# Patient Record
Sex: Female | Born: 1985 | Race: White | Hispanic: No | Marital: Married | Smoking: Never smoker
Health system: Southern US, Community
[De-identification: ages and names within clinical notes are randomized; demographics above are authoritative.]

## PROBLEM LIST (undated history)

## (undated) DIAGNOSIS — Z8619 Personal history of other infectious and parasitic diseases: Secondary | ICD-10-CM

## (undated) DIAGNOSIS — F32A Depression, unspecified: Secondary | ICD-10-CM

## (undated) DIAGNOSIS — R011 Cardiac murmur, unspecified: Secondary | ICD-10-CM

## (undated) DIAGNOSIS — F329 Major depressive disorder, single episode, unspecified: Secondary | ICD-10-CM

## (undated) HISTORY — DX: Personal history of other infectious and parasitic diseases: Z86.19

## (undated) HISTORY — DX: Cardiac murmur, unspecified: R01.1

## (undated) HISTORY — PX: NO PAST SURGERIES: SHX2092

---

## 2007-01-06 ENCOUNTER — Ambulatory Visit: Payer: Self-pay | Admitting: Family Medicine

## 2007-01-06 DIAGNOSIS — L708 Other acne: Secondary | ICD-10-CM | POA: Insufficient documentation

## 2007-01-06 DIAGNOSIS — J309 Allergic rhinitis, unspecified: Secondary | ICD-10-CM | POA: Insufficient documentation

## 2007-10-14 ENCOUNTER — Emergency Department (HOSPITAL_BASED_OUTPATIENT_CLINIC_OR_DEPARTMENT_OTHER): Admission: EM | Admit: 2007-10-14 | Discharge: 2007-10-14 | Payer: Self-pay | Admitting: Emergency Medicine

## 2008-09-30 ENCOUNTER — Ambulatory Visit: Payer: Self-pay | Admitting: Family Medicine

## 2008-09-30 LAB — CONVERTED CEMR LAB
Protein, U semiquant: 30
Specific Gravity, Urine: >=1.03
Urobilinogen, UA: 0.2

## 2008-11-07 ENCOUNTER — Ambulatory Visit: Payer: Self-pay | Admitting: Family Medicine

## 2008-11-07 ENCOUNTER — Encounter: Payer: Self-pay | Admitting: Family Medicine

## 2008-11-07 ENCOUNTER — Other Ambulatory Visit: Admission: RE | Admit: 2008-11-07 | Discharge: 2008-11-07 | Payer: Self-pay | Admitting: Family Medicine

## 2008-12-29 ENCOUNTER — Encounter: Payer: Self-pay | Admitting: Family Medicine

## 2009-09-01 ENCOUNTER — Ambulatory Visit: Payer: Self-pay | Admitting: Family Medicine

## 2009-09-01 DIAGNOSIS — R5381 Other malaise: Secondary | ICD-10-CM | POA: Insufficient documentation

## 2009-09-01 DIAGNOSIS — R5383 Other fatigue: Secondary | ICD-10-CM

## 2009-09-02 ENCOUNTER — Encounter: Payer: Self-pay | Admitting: Family Medicine

## 2009-09-02 LAB — CONVERTED CEMR LAB
Basophils Relative: 0 % (ref 0–1)
Eosinophils Absolute: 0.2 10*3/uL (ref 0.0–0.7)
Eosinophils Relative: 1 % (ref 0–5)
HCT: 38.3 % (ref 36.0–46.0)
Lymphs Abs: 5.3 10*3/uL — ABNORMAL HIGH (ref 0.7–4.0)
MCHC: 31.9 g/dL (ref 30.0–36.0)
MCV: 87.6 fL (ref 78.0–100.0)
Monocytes Relative: 7 % (ref 3–12)
Platelets: 378 10*3/uL (ref 150–400)
WBC: 12.1 10*3/uL — ABNORMAL HIGH (ref 4.0–10.5)

## 2009-11-26 ENCOUNTER — Other Ambulatory Visit: Admission: RE | Admit: 2009-11-26 | Discharge: 2009-11-26 | Payer: Self-pay | Admitting: Family Medicine

## 2009-11-26 ENCOUNTER — Ambulatory Visit: Payer: Self-pay | Admitting: Family Medicine

## 2010-05-12 NOTE — Assessment & Plan Note (Signed)
Summary: check LN   Vital Signs:  Patient profile:   25 year old female Height:      63.5 inches Weight:      174 pounds BMI:     30.45 O2 Sat:      99 % on Room air Temp:     98.4 degrees F oral Pulse rate:   80 / minute BP sitting:   126 / 73  (left arm) Cuff size:   regular  Vitals Entered By: Payton Spark CMA (Sep 01, 2009 4:23 PM)  O2 Flow:  Room air CC: Swollen gland in neck x 3 days.    Primary Care Provider:  Linford Arnold, C  CC:  Swollen gland in neck x 3 days. Marland Kitchen  History of Present Illness: 25 yo WF presents for a swollen LN on the L side of her neck x 3 days.  It is tender.  She has not had a sore throat, fever, ear pain.  No sinus congestion.  No current HA.  No skin problems.    She is otherwise feeling well.  No problems swallowing.  A little tired.  Current Medications (verified): 1)  Claritin 10 Mg Tabs (Loratadine) .... Take 1 Tab By Mouth Once Daily 2)  Yaz 3-0.02 Mg Tabs (Drospirenone-Ethinyl Estradiol) .... Take 1 Tablet By Mouth Once A Day 3)  Differin 0.1 %  Gel (Adapalene) .... Apply To Face At Bedtime  Allergies (verified): No Known Drug Allergies  Past History:  Past Medical History: Reviewed history from 01/06/2007 and no changes required. None  Review of Systems      See HPI  Physical Exam  General:  alert, well-developed, well-nourished, and well-hydrated.   Head:  normocephalic and atraumatic.   Mouth:  good dentition and pharynx pink and moist.   Neck:  supple.  normal sized thyroid gland with a small palpable cyst vs anterior cervical chain LN on the L side, tender and moveable Lungs:  Normal respiratory effort, chest expands symmetrically. Lungs are clear to auscultation, no crackles or wheezes. Heart:  Normal rate and regular rhythm. S1 and S2 normal without gallop, murmur, click, rub or other extra sounds. Skin:  color normal and no suspicious lesions.     Impression & Recommendations:  Problem # 1:  CERVICAL LYMPHADENOPATHY  (ICD-785.6) REactive cervical lymphadenopathy vs a small thyroid cyst given this location. Will get a CBC with diff and a TSH today.  F/U results tomorrow. No sign of head infection and no other lymphadenopathy.  U/S thryoid gland if not improved after 10 days. Orders: T-CBC w/Diff ([id number])  Complete Medication List: 1)  Claritin 10 Mg Tabs (Loratadine) .... Take 1 tab by mouth once daily 2)  Yaz 3-0.02 Mg Tabs (Drospirenone-ethinyl estradiol) .... Take 1 tablet by mouth once a day 3)  Differin 0.1 % Gel (Adapalene) .... Apply to face at bedtime  Other Orders: T-TSH ([id number])  Patient Instructions: 1)  Lab today. 2)  Will call you w/ results tomorrow. 3)  Use heat/ ice/ ibuprofen for comfort. 4)  Return in 10 days if not improved.

## 2010-05-12 NOTE — Assessment & Plan Note (Signed)
Summary: Pap and birth control   Vital Signs:  Patient profile:   25 year old female LMP:     11/03/2009 Height:      63.5 inches Weight:      177 pounds Pulse rate:   72 / minute BP sitting:   111 / 75  (left arm) Cuff size:   regular  Vitals Entered By: Avon Gully CMA, Duncan Dull) (November 26, 2009 8:38 AM) CC: PAP--need refill of BCP LMP (date): 11/03/2009     Enter LMP: 11/03/2009 Last PAP Result **ATYPICAL SQUAMOUS CELLS OF UNDETERMINED SIGNIFICANCE   Primary Care Provider:  Linford Arnold, C  CC:  PAP--need refill of BCP.  History of Present Illness: Pap only and refill on OCPs. She is very happywiht her current OCPs.    Current Medications (verified): 1)  Claritin 10 Mg Tabs (Loratadine) .... Take 1 Tab By Mouth Once Daily 2)  Yaz 3-0.02 Mg Tabs (Drospirenone-Ethinyl Estradiol) .... Take 1 Tablet By Mouth Once A Day 3)  Differin 0.1 %  Gel (Adapalene) .... Apply To Face At Bedtime  Allergies (verified): No Known Drug Allergies  Comments:  Nurse/Medical Assistant: The patient's medications and allergies were reviewed with the patient and were updated in the Medication and Allergy Lists. Avon Gully CMA, Duncan Dull) (November 26, 2009 8:39 AM)  Physical Exam  General:  Well-developed,well-nourished,in no acute distress; alert,appropriate and cooperative throughout examination Genitalia:  Normal introitus for age, no external lesions, no vaginal discharge, mucosa pink and moist, no vaginal or cervical lesions, no vaginal atrophy, no friaility or hemorrhage, normal uterus size and position, no adnexal masses or tenderness   Impression & Recommendations:  Problem # 1:  SCREENING FOR MALIGNANT NEOPLASM OF THE CERVIX (ICD-V76.2) Pap performed will call with resulsin 2 weeks. Bc of age will screen for GC and chlam.  Rfeilled her OCPs.   Complete Medication List: 1)  Claritin 10 Mg Tabs (Loratadine) .... Take 1 tab by mouth once daily 2)  Yaz 3-0.02 Mg Tabs  (Drospirenone-ethinyl estradiol) .... Take 1 tablet by mouth once a day 3)  Differin 0.1 % Gel (Adapalene) .... Apply to face at bedtime Prescriptions: YAZ 3-0.02 MG TABS (DROSPIRENONE-ETHINYL ESTRADIOL) Take 1 tablet by mouth once a day  #3 pack x 4   Entered and Authorized by:   Nani Gasser MD   Signed by:   Nani Gasser MD on 11/26/2009   Method used:   Electronically to        [facility] [phone]* (retail)       [address]       [place], [place]  [address]       Ph: [phone]       Fax: [phone]   RxID:   [id number]

## 2011-06-21 ENCOUNTER — Emergency Department
Admission: EM | Admit: 2011-06-21 | Discharge: 2011-06-21 | Disposition: A | Discharge status: Home Health | Payer: Managed Care, Other (non HMO) | Source: Home / Self Care

## 2011-06-21 ENCOUNTER — Encounter: Payer: Self-pay | Admitting: *Deleted

## 2011-06-21 DIAGNOSIS — J209 Acute bronchitis, unspecified: Secondary | ICD-10-CM

## 2011-06-21 MED ORDER — CLARITHROMYCIN 500 MG PO TABS: 500.0000 mg | ORAL_TABLET | Freq: Two times a day (BID) | ORAL | Status: AC

## 2011-06-21 MED ORDER — BENZONATATE 200 MG PO CAPS: 200.0000 mg | ORAL_CAPSULE | Freq: Every day | ORAL | Status: AC

## 2011-06-21 NOTE — ED Notes (Signed)
Patient c/o productive cough x 2 weeks. Denies fever. Used robitussin and mucinex otc.

## 2011-06-21 NOTE — ED Provider Notes (Signed)
History     CSN: [id number]  Arrival date & time 06/21/11  4098   None     Chief Complaint  Patient presents with  . Cough      HPI Comments: Patient complains of approximately 2.5 week history of gradually progressive URI symptoms beginning with nasal congestion but no sore throat.  A cough started about 2 weeks ago.  Complains of fatigue and initial myalgias.  Cough is now worse at night and generally non-productive during the day.  There has been no pleuritic pain, shortness of breath, or wheezes.  She now coughs until she gags.  She believes that she may have had a tetanus shot in 2009.  The history is provided by the patient.    History reviewed. No pertinent past medical history.  History reviewed. No pertinent past surgical history.  History reviewed. No pertinent family history.  History  Substance Use Topics  . Smoking status: Never Smoker   . Smokeless tobacco: Not on file  . Alcohol Use: No    OB History    Grav Para Term Preterm Abortions TAB SAB Ect Mult Living                  Review of Systems No sore throat + cough No pleuritic pain No wheezing + nasal congestion + post-nasal drainage No sinus pain/pressure No itchy/red eyes No earache No hemoptysis No SOB No fever/chills No nausea No vomiting No abdominal pain No diarrhea No urinary symptoms No skin rashes + fatigue No myalgias No headache Used OTC meds without relief (Robitussin) Allergies  Review of patient's allergies indicates no known allergies.  Home Medications   Current Outpatient Rx  Name Route Sig Dispense Refill  . BENZONATATE 200 MG PO CAPS Oral Take 1 capsule (200 mg total) by mouth at bedtime. Take as needed for cough 12 capsule 0  . CLARITHROMYCIN 500 MG PO TABS Oral Take 1 tablet (500 mg total) by mouth 2 (two) times daily. Take for one week 14 tablet 0    BP 128/79  Pulse 79  Temp(Src) 97.5 F (36.4 C) (Oral)  Resp 14  Ht 5\' 3"  (1.6 m)  Wt 175 lb (79.379  kg)  BMI 31.00 kg/m2  SpO2 100%  Physical Exam Nursing notes and Vital Signs reviewed. Appearance:  Patient appears healthy, stated age, and in no acute distress Eyes:  Pupils are equal, round, and reactive to light and accomodation.  Extraocular movement is intact.  Conjunctivae are not inflamed  Ears:  Canals normal.  Tympanic membranes normal.  Nose:  Mildly congested turbinates.  No sinus tenderness.   Pharynx:  Normal Neck:  Supple.  Slightly tender shotty posterior nodes are palpated bilaterally  Lungs:  Clear to auscultation.  Breath sounds are equal.  Heart:  Regular rate and rhythm without murmurs, rubs, or gallops.  Abdomen:  Nontender without masses or hepatosplenomegaly.  Bowel sounds are present.  No CVA or flank tenderness.  Extremities:  No edema.  No calf tenderness Skin:  No rash present.   ED Course  Procedures  none      1. Acute bronchitis       MDM  ? Pertussis Begin Biaxin, and Tessalon at bedtime Take plain Mucinex (guaifenesin) twice daily for cough and congestion.  Increase fluid intake  May add Sudafed for sinus congestion. Stop all antihistamines for now, and other non-prescription cough/cold preparations. Recommend a Tdap when well.  Follow-up with family doctor if not improving one week.  Lattie Haw, MD 06/21/11 [phone]

## 2011-06-21 NOTE — Discharge Instructions (Signed)
Take plain Mucinex (guaifenesin) twice daily for cough and congestion.  Increase fluid intake  May add Sudafed for sinus congestion. Stop all antihistamines for now, and other non-prescription cough/cold preparations. Recommend a Tdap when well.  Follow-up with family doctor if not improving one week.

## 2012-04-12 NOTE — L&D Delivery Note (Signed)
Delivery Note At 8:42 PM a viable and healthy female was delivered via Vaginal, Spontaneous Delivery (Presentation: Left Occiput; Anterior  ).  APGAR: 9, 9; weight pending.   Placenta status: Intact, Spontaneous.  Cord: 3 vessels   Anesthesia: Epidural 5 cc 15 lidocaine Episiotomy: None Lacerations: 1st degree Suture Repair: 3.0 vicryl Est. Blood Loss (mL): 300  Mom to postpartum.  Baby to nursery-stable.  Robin Montoya H. 02/07/2013, 9:01 PM

## 2012-06-20 ENCOUNTER — Other Ambulatory Visit: Payer: Self-pay | Admitting: Obstetrics and Gynecology

## 2012-07-25 LAB — OB RESULTS CONSOLE ABO/RH: RH Type: POSITIVE

## 2012-07-25 LAB — OB RESULTS CONSOLE RUBELLA ANTIBODY, IGM: Rubella: IMMUNE

## 2013-02-06 ENCOUNTER — Encounter (HOSPITAL_COMMUNITY): Payer: Self-pay | Admitting: *Deleted

## 2013-02-06 ENCOUNTER — Telehealth (HOSPITAL_COMMUNITY): Payer: Self-pay | Admitting: *Deleted

## 2013-02-06 NOTE — Telephone Encounter (Signed)
Preadmission screen  

## 2013-02-07 ENCOUNTER — Inpatient Hospital Stay (HOSPITAL_COMMUNITY)
Admission: AD | Admit: 2013-02-07 | Discharge: 2013-02-07 | Disposition: A | Discharge status: Home / Self Care | Payer: BC Managed Care – PPO | Source: Ambulatory Visit | Attending: Obstetrics and Gynecology | Admitting: Obstetrics and Gynecology

## 2013-02-07 ENCOUNTER — Inpatient Hospital Stay (HOSPITAL_COMMUNITY)
Admission: AD | Admit: 2013-02-07 | Discharge: 2013-02-08 | DRG: 775 | Disposition: A | Discharge status: Home / Self Care | Payer: BC Managed Care – PPO | Source: Ambulatory Visit | Attending: Obstetrics and Gynecology | Admitting: Obstetrics and Gynecology

## 2013-02-07 ENCOUNTER — Encounter (HOSPITAL_COMMUNITY): Payer: Self-pay | Admitting: *Deleted

## 2013-02-07 ENCOUNTER — Encounter (HOSPITAL_COMMUNITY): Payer: Self-pay

## 2013-02-07 ENCOUNTER — Inpatient Hospital Stay (HOSPITAL_COMMUNITY): Payer: BC Managed Care – PPO | Admitting: Anesthesiology

## 2013-02-07 ENCOUNTER — Encounter (HOSPITAL_COMMUNITY): Payer: BC Managed Care – PPO | Admitting: Anesthesiology

## 2013-02-07 DIAGNOSIS — O99892 Other specified diseases and conditions complicating childbirth: Secondary | ICD-10-CM | POA: Diagnosis present

## 2013-02-07 DIAGNOSIS — O479 False labor, unspecified: Secondary | ICD-10-CM | POA: Insufficient documentation

## 2013-02-07 DIAGNOSIS — Z22330 Carrier of Group B streptococcus: Secondary | ICD-10-CM

## 2013-02-07 HISTORY — DX: Depression, unspecified: F32.A

## 2013-02-07 HISTORY — DX: Major depressive disorder, single episode, unspecified: F32.9

## 2013-02-07 LAB — COMPREHENSIVE METABOLIC PANEL
ALT: 10 U/L (ref 0–35)
AST: 14 U/L (ref 0–37)
CO2: 20 mEq/L (ref 19–32)
Chloride: 101 mEq/L (ref 96–112)
GFR calc non Af Amer: >90 mL/min (ref >90)
Glucose, Bld: 106 mg/dL — ABNORMAL HIGH (ref 70–99)
Sodium: 136 mEq/L (ref 135–145)
Total Bilirubin: 0.2 mg/dL — ABNORMAL LOW (ref 0.3–1.2)

## 2013-02-07 LAB — ABO/RH: ABO/RH(D): O POS

## 2013-02-07 LAB — TYPE AND SCREEN
ABO/RH(D): O POS
Antibody Screen: NEGATIVE

## 2013-02-07 LAB — CBC
HCT: 36.8 % (ref 36.0–46.0)
MCHC: 34.2 g/dL (ref 30.0–36.0)
MCV: 83.8 fL (ref 78.0–100.0)
RDW: 13.9 % (ref 11.5–15.5)

## 2013-02-07 MED ORDER — BUTORPHANOL TARTRATE 1 MG/ML IJ SOLN: 1.0000 mg | INTRAMUSCULAR | Status: DC | PRN

## 2013-02-07 MED ORDER — DIPHENHYDRAMINE HCL 50 MG/ML IJ SOLN: 12.5000 mg | INTRAMUSCULAR | Status: DC | PRN

## 2013-02-07 MED ORDER — OXYCODONE-ACETAMINOPHEN 5-325 MG PO TABS: 1.0000 | ORAL_TABLET | ORAL | Status: DC | PRN

## 2013-02-07 MED ORDER — LIDOCAINE HCL (PF) 1 % IJ SOLN
30.0000 mL | INTRAMUSCULAR | Status: DC | PRN
  Filled 2013-02-07 (×2): qty 30

## 2013-02-07 MED ORDER — LANOLIN HYDROUS EX OINT: TOPICAL_OINTMENT | CUTANEOUS | Status: DC | PRN

## 2013-02-07 MED ORDER — LACTATED RINGERS IV SOLN: 500.0000 mL | INTRAVENOUS | Status: DC | PRN

## 2013-02-07 MED ORDER — IBUPROFEN 600 MG PO TABS: 600.0000 mg | ORAL_TABLET | Freq: Four times a day (QID) | ORAL | Status: DC | PRN

## 2013-02-07 MED ORDER — ACETAMINOPHEN 325 MG PO TABS: 650.0000 mg | ORAL_TABLET | ORAL | Status: DC | PRN

## 2013-02-07 MED ORDER — LACTATED RINGERS IV SOLN: 500.0000 mL | Freq: Once | INTRAVENOUS | Status: DC

## 2013-02-07 MED ORDER — TERBUTALINE SULFATE 1 MG/ML IJ SOLN: 0.2500 mg | Freq: Once | INTRAMUSCULAR | Status: DC | PRN

## 2013-02-07 MED ORDER — FLEET ENEMA 7-19 GM/118ML RE ENEM: 1.0000 | ENEMA | Freq: Once | RECTAL | Status: DC

## 2013-02-07 MED ORDER — BENZOCAINE-MENTHOL 20-0.5 % EX AERO
1.0000 "application " | INHALATION_SPRAY | CUTANEOUS | Status: DC | PRN
  Administered 2013-02-08: 1 via TOPICAL
  Filled 2013-02-07: qty 56

## 2013-02-07 MED ORDER — PHENYLEPHRINE 40 MCG/ML (10ML) SYRINGE FOR IV PUSH (FOR BLOOD PRESSURE SUPPORT)
80.0000 ug | PREFILLED_SYRINGE | INTRAVENOUS | Status: DC | PRN
  Filled 2013-02-07: qty 2
  Filled 2013-02-07: qty 10

## 2013-02-07 MED ORDER — OXYCODONE-ACETAMINOPHEN 5-325 MG PO TABS
1.0000 | ORAL_TABLET | ORAL | Status: DC | PRN
Start: 2013-02-07 — End: 2013-02-07

## 2013-02-07 MED ORDER — IBUPROFEN 600 MG PO TABS
600.0000 mg | ORAL_TABLET | Freq: Four times a day (QID) | ORAL | Status: DC
  Administered 2013-02-07 – 2013-02-08 (×4): 600 mg via ORAL
  Filled 2013-02-07 (×4): qty 1

## 2013-02-07 MED ORDER — ZOLPIDEM TARTRATE 5 MG PO TABS: 5.0000 mg | ORAL_TABLET | Freq: Every evening | ORAL | Status: DC | PRN

## 2013-02-07 MED ORDER — LIDOCAINE HCL (PF) 1 % IJ SOLN
INTRAMUSCULAR | Status: DC | PRN
  Administered 2013-02-07 (×2): 5 mL

## 2013-02-07 MED ORDER — TETANUS-DIPHTH-ACELL PERTUSSIS 5-2.5-18.5 LF-MCG/0.5 IM SUSP
0.5000 mL | Freq: Once | INTRAMUSCULAR | Status: AC
  Administered 2013-02-08: 0.5 mL via INTRAMUSCULAR
  Filled 2013-02-07: qty 0.5

## 2013-02-07 MED ORDER — OXYTOCIN 40 UNITS IN LACTATED RINGERS INFUSION - SIMPLE MED
1.0000 m[IU]/min | INTRAVENOUS | Status: DC
  Administered 2013-02-07: 2 m[IU]/min via INTRAVENOUS
  Filled 2013-02-07: qty 1000

## 2013-02-07 MED ORDER — WITCH HAZEL-GLYCERIN EX PADS: 1.0000 "application " | MEDICATED_PAD | CUTANEOUS | Status: DC | PRN

## 2013-02-07 MED ORDER — ONDANSETRON HCL 4 MG PO TABS: 4.0000 mg | ORAL_TABLET | ORAL | Status: DC | PRN

## 2013-02-07 MED ORDER — EPHEDRINE 5 MG/ML INJ
10.0000 mg | INTRAVENOUS | Status: DC | PRN
  Filled 2013-02-07: qty 4
  Filled 2013-02-07: qty 2

## 2013-02-07 MED ORDER — DIPHENHYDRAMINE HCL 25 MG PO CAPS: 25.0000 mg | ORAL_CAPSULE | Freq: Four times a day (QID) | ORAL | Status: DC | PRN

## 2013-02-07 MED ORDER — LACTATED RINGERS IV SOLN
INTRAVENOUS | Status: DC
  Administered 2013-02-07 (×2): 125 mL/h via INTRAVENOUS

## 2013-02-07 MED ORDER — PRENATAL MULTIVITAMIN CH
1.0000 | ORAL_TABLET | Freq: Every day | ORAL | Status: DC
  Administered 2013-02-08: 1 via ORAL
  Filled 2013-02-07: qty 1

## 2013-02-07 MED ORDER — PENICILLIN G POTASSIUM 5000000 UNITS IJ SOLR
5.0000 10*6.[IU] | Freq: Once | INTRAVENOUS | Status: AC
  Administered 2013-02-07: 5 10*6.[IU] via INTRAVENOUS
  Filled 2013-02-07: qty 5

## 2013-02-07 MED ORDER — OXYTOCIN 40 UNITS IN LACTATED RINGERS INFUSION - SIMPLE MED: 62.5000 mL/h | INTRAVENOUS | Status: DC

## 2013-02-07 MED ORDER — OXYTOCIN BOLUS FROM INFUSION: 500.0000 mL | INTRAVENOUS | Status: DC

## 2013-02-07 MED ORDER — ONDANSETRON HCL 4 MG/2ML IJ SOLN: 4.0000 mg | Freq: Four times a day (QID) | INTRAMUSCULAR | Status: DC | PRN

## 2013-02-07 MED ORDER — ONDANSETRON HCL 4 MG/2ML IJ SOLN: 4.0000 mg | INTRAMUSCULAR | Status: DC | PRN

## 2013-02-07 MED ORDER — DIBUCAINE 1 % RE OINT: 1.0000 "application " | TOPICAL_OINTMENT | RECTAL | Status: DC | PRN

## 2013-02-07 MED ORDER — SIMETHICONE 80 MG PO CHEW: 80.0000 mg | CHEWABLE_TABLET | ORAL | Status: DC | PRN

## 2013-02-07 MED ORDER — CITRIC ACID-SODIUM CITRATE 334-500 MG/5ML PO SOLN: 30.0000 mL | ORAL | Status: DC | PRN

## 2013-02-07 MED ORDER — FENTANYL 2.5 MCG/ML BUPIVACAINE 1/10 % EPIDURAL INFUSION (WH - ANES)
14.0000 mL/h | INTRAMUSCULAR | Status: DC | PRN
  Administered 2013-02-07 (×2): 14 mL/h via EPIDURAL
  Filled 2013-02-07 (×2): qty 125

## 2013-02-07 MED ORDER — SENNOSIDES-DOCUSATE SODIUM 8.6-50 MG PO TABS
2.0000 | ORAL_TABLET | ORAL | Status: DC
  Administered 2013-02-07: 2 via ORAL
  Filled 2013-02-07: qty 2

## 2013-02-07 MED ORDER — PENICILLIN G POTASSIUM 5000000 UNITS IJ SOLR
2.5000 10*6.[IU] | INTRAVENOUS | Status: DC
  Administered 2013-02-07 (×2): 2.5 10*6.[IU] via INTRAVENOUS
  Filled 2013-02-07 (×4): qty 2.5

## 2013-02-07 MED ORDER — PHENYLEPHRINE 40 MCG/ML (10ML) SYRINGE FOR IV PUSH (FOR BLOOD PRESSURE SUPPORT)
80.0000 ug | PREFILLED_SYRINGE | INTRAVENOUS | Status: DC | PRN
  Filled 2013-02-07: qty 2

## 2013-02-07 MED ORDER — EPHEDRINE 5 MG/ML INJ
10.0000 mg | INTRAVENOUS | Status: DC | PRN
  Filled 2013-02-07: qty 2

## 2013-02-07 NOTE — Anesthesia Procedure Notes (Signed)
Epidural Patient location during procedure: OB Start time: 02/07/2013 1:52 PM  Staffing Anesthesiologist: Brayton Caves Performed by: anesthesiologist   Preanesthetic Checklist Completed: patient identified, site marked, surgical consent, pre-op evaluation, timeout performed, IV checked, risks and benefits discussed and monitors and equipment checked  Epidural Patient position: sitting Prep: site prepped and draped and DuraPrep Patient monitoring: continuous pulse ox and blood pressure Approach: midline Injection technique: LOR air  Needle:  Needle type: Tuohy  Needle gauge: 17 G Needle length: 9 cm and 9 Needle insertion depth: 5 cm cm Catheter type: closed end flexible Catheter size: 19 Gauge Catheter at skin depth: 10 cm Test dose: negative  Assessment Events: blood not aspirated, injection not painful, no injection resistance, negative IV test and no paresthesia  Additional Notes Patient identified.  Risk benefits discussed including failed block, incomplete pain control, headache, nerve damage, paralysis, blood pressure changes, nausea, vomiting, reactions to medication both toxic or allergic, and postpartum back pain.  Patient expressed understanding and wished to proceed.  All questions were answered.  Sterile technique used throughout procedure and epidural site dressed with sterile barrier dressing. No paresthesia or other complications noted.The patient did not experience any signs of intravascular injection such as tinnitus or metallic taste in mouth nor signs of intrathecal spread such as rapid motor block. Please see nursing notes for vital signs.

## 2013-02-07 NOTE — MAU Note (Signed)
Dr Tenny Craw notified of pt's VE, FHR pattern, contraction pattern, orders received to discharge home.

## 2013-02-07 NOTE — MAU Note (Signed)
Pt presents for increase in strength of contractions. She was sent home from MAU a couple of hours ago and she states the contractions are now closer and stronger.

## 2013-02-07 NOTE — MAU Note (Signed)
Plan of care d/w Dr. Henderson Cloud. Recheck pt in 1-2 hours, Pt may be off of monitor and ambulate.

## 2013-02-07 NOTE — Anesthesia Preprocedure Evaluation (Signed)
Anesthesia Evaluation  Patient identified by MRN, date of birth, ID band Patient awake    Reviewed: Allergy & Precautions, H&P , Patient's Chart, lab work & pertinent test results  Airway Mallampati: II TM Distance: >3 FB Neck ROM: full    Dental no notable dental hx.    Pulmonary neg pulmonary ROS,  breath sounds clear to auscultation  Pulmonary exam normal       Cardiovascular negative cardio ROS  Rhythm:regular Rate:Normal     Neuro/Psych PSYCHIATRIC DISORDERS negative neurological ROS  negative psych ROS   GI/Hepatic negative GI ROS, Neg liver ROS,   Endo/Other  negative endocrine ROS  Renal/GU negative Renal ROS     Musculoskeletal   Abdominal   Peds  Hematology negative hematology ROS (+)   Anesthesia Other Findings   Reproductive/Obstetrics (+) Pregnancy                           Anesthesia Physical Anesthesia Plan  ASA: II  Anesthesia Plan: Epidural   Post-op Pain Management:    Induction:   Airway Management Planned:   Additional Equipment:   Intra-op Plan:   Post-operative Plan:   Informed Consent: I have reviewed the patients History and Physical, chart, labs and discussed the procedure including the risks, benefits and alternatives for the proposed anesthesia with the patient or authorized representative who has indicated his/her understanding and acceptance.     Plan Discussed with:   Anesthesia Plan Comments:         Anesthesia Quick Evaluation

## 2013-02-07 NOTE — MAU Note (Signed)
Contractions every 5 minutes since 3 am this morning. Denies LOF or VB. Positive fetal movement. Denies complications with pregnancy.

## 2013-02-07 NOTE — MAU Note (Signed)
Pt reports uc's since 3am. Denies LOF or VB.

## 2013-02-07 NOTE — Progress Notes (Signed)
Dr Tenny Craw updated on pt's VE, FHR, contraction pattern, orders received to admit pt

## 2013-02-07 NOTE — H&P (Signed)
Robin Montoya is a 27 y.o. female presenting for early labor  27 yo G1P0 @ 40+4 presents in early labor. Pt was seen in the early hours of the morning and her cervix had not changed so she was discharged home.  She returned with some increased pain and bloody show and had slightly changed her cervix. She was admitted for pain management History OB History   Grav Para Term Preterm Abortions TAB SAB Ect Mult Living   1 0 0 0 0 0 0 0 0 0      Past Medical History  Diagnosis Date  . Hx of varicella   . Heart murmur     insufficient  . Depression    Past Surgical History  Procedure Laterality Date  . No past surgeries     Family History: family history includes Bipolar disorder in her brother; Cancer in her maternal grandmother; Depression in her brother; Diabetes in her maternal grandfather; Heart disease in her maternal grandfather; Hypertension in her maternal grandfather and maternal grandmother; Kidney Stones in her father and mother. Social History:  reports that she has never smoked. She has never used smokeless tobacco. She reports that she does not drink alcohol or use illicit drugs.   Prenatal Transfer Tool  Maternal Diabetes: No Genetic Screening: Normal Maternal Ultrasounds/Referrals: Normal Fetal Ultrasounds or other Referrals:  None Maternal Substance Abuse:  No Significant Maternal Medications:  None Significant Maternal Lab Results:  Lab values include: Group B Strep positive Other Comments:  None  ROS : as above   Dilation: 2.5 Effacement (%): 90 Station: -2 Exam by:: G Morris RN Blood pressure 152/90, pulse 87, temperature 98.1 F (36.7 C), temperature source Oral, resp. rate 18, height 5\' 3"  (1.6 m), weight 91.173 kg (201 lb), last menstrual period 04/21/2012. Exam Physical Exam  Prenatal labs: ABO, Rh: O/Positive/-- (04/15 0000) Antibody:  Negative Rubella: Immune (04/15 0000) RPR: Nonreactive (04/15 0000)  HBsAg: Negative (04/15 0000)  HIV:  Non-reactive (04/15 0000)  GBS: Positive (09/30 0000)   Assessment/Plan: 1) Admit 2) Epidural on request 3) PCN for GBS  Vadim Centola H. 02/07/2013, 12:34 PM

## 2013-02-08 LAB — CBC
MCH: 28.5 pg (ref 26.0–34.0)
MCHC: 33.4 g/dL (ref 30.0–36.0)
MCV: 85.2 fL (ref 78.0–100.0)
Platelets: 240 10*3/uL (ref 150–400)
RBC: 3.86 MIL/uL — ABNORMAL LOW (ref 3.87–5.11)
WBC: 14.1 10*3/uL — ABNORMAL HIGH (ref 4.0–10.5)

## 2013-02-08 NOTE — Discharge Summary (Signed)
Obstetric Discharge Summary Reason for Admission: onset of labor Prenatal Procedures: none Intrapartum Procedures: spontaneous vaginal delivery Postpartum Procedures: none Complications-Operative and Postpartum: 1 degree perineal laceration Hemoglobin  Date Value Range Status  02/08/2013 11.0* 12.0 - 15.0 g/dL Final     HCT  Date Value Range Status  02/08/2013 32.9* 36.0 - 46.0 % Final    Discharge Diagnoses: Term Pregnancy-delivered  Discharge Information: Date: 02/08/2013 Activity: pelvic rest Diet: routine Medications: Ibuprofen Condition: stable Instructions: refer to practice specific booklet Discharge to: home Follow-up Information   Follow up with Almon Hercules., MD In 4 weeks.   Specialty:  Obstetrics and Gynecology   Contact information:   [address] [place] [address] [phone]       Newborn Data: Live born female  Birth Weight: 7 lb 3.2 oz (3266 g) APGAR: 9, 9  Home with mother.  Izic Stfort A 02/08/2013, 8:43 AM

## 2013-02-08 NOTE — Progress Notes (Signed)
Patient was referred for history of depression/anxiety. * Referral screened out by Clinical Social Worker because none of the following criteria appear to apply:  ~ History of anxiety/depression during this pregnancy, or of post-partum depression.  ~ Diagnosis of anxiety and/or depression within last 3 years, per pt.  ~ History of depression due to pregnancy loss/loss of child  OR * Patient's symptoms currently being treated with medication and/or therapy.  Please contact the Clinical Social Worker if needs arise, or by the patient's request.  

## 2013-02-08 NOTE — Anesthesia Postprocedure Evaluation (Signed)
Anesthesia Post Note  Patient: Robin Montoya  Procedure(s) Performed: * No procedures listed *  Anesthesia type: Epidural  Patient location: Mother/Baby  Post pain: Pain level controlled  Post assessment: Post-op Vital signs reviewed  Last Vitals:  Filed Vitals:   02/08/13 0620  BP: 116/77  Pulse: 71  Temp:   Resp: 16    Post vital signs: Reviewed  Level of consciousness:alert  Complications: No apparent anesthesia complications

## 2013-02-08 NOTE — Progress Notes (Addendum)
Patient is eating, ambulating, voiding.  Pain control is good.  Filed Vitals:   02/07/13 2221 02/07/13 2320 02/08/13 0223 02/08/13 0620  BP: 146/84 134/77 126/72 116/77  Pulse: 93 99 79 71  Temp: 98.1 F (36.7 C) 98.5 F (36.9 C) 98.1 F (36.7 C)   TempSrc: Oral Oral Oral   Resp: 16 16 16 16   Height:      Weight:      SpO2: 97% 96% 97% 98%    Fundus firm Perineum without swelling.  Lab Results  Component Value Date   WBC 14.1* 02/08/2013   HGB 11.0* 02/08/2013   HCT 32.9* 02/08/2013   MCV 85.2 02/08/2013   PLT 240 02/08/2013    --/--/O POS (10/29 1400)/RI  A/P Post partum day 1.  Routine care.  Expect d/c today.    Andriea Hasegawa A

## 2013-02-08 NOTE — Progress Notes (Signed)
Patient reports swollen second toe on left foot, present X 3 months. Has not brought to physicians attention. Mild swelling noted, warm to touch, color appropriate for race, bilateral pedal pulses present. Patient reports no pain at site, only occasional discomfort. Will report to oncoming RN. Will continue to monitor.

## 2013-02-13 ENCOUNTER — Inpatient Hospital Stay (HOSPITAL_COMMUNITY): Admission: RE | Admit: 2013-02-13 | Payer: BC Managed Care – PPO | Source: Ambulatory Visit

## 2013-04-24 ENCOUNTER — Emergency Department
Admission: EM | Admit: 2013-04-24 | Discharge: 2013-04-24 | Disposition: A | Discharge status: Home / Self Care | Payer: BC Managed Care – PPO | Source: Home / Self Care | Attending: Emergency Medicine | Admitting: Emergency Medicine

## 2013-04-24 ENCOUNTER — Encounter: Payer: Self-pay | Admitting: Emergency Medicine

## 2013-04-24 DIAGNOSIS — J209 Acute bronchitis, unspecified: Secondary | ICD-10-CM

## 2013-04-24 MED ORDER — AZITHROMYCIN 250 MG PO TABS: ORAL_TABLET | ORAL | Status: DC

## 2013-04-24 MED ORDER — BENZONATATE 100 MG PO CAPS
100.0000 mg | ORAL_CAPSULE | Freq: Three times a day (TID) | ORAL | Status: DC
Start: 2013-04-24 — End: 2019-05-18

## 2013-04-24 NOTE — ED Notes (Signed)
Pt c/o URI x 3wks ago with lingering productive cough. Denies fever. She has taken Mucinex and Robt.

## 2013-04-24 NOTE — ED Provider Notes (Signed)
CSN: [id number]     Arrival date & time 04/24/13  0803 History   First MD Initiated Contact with Patient 04/24/13 0830     Chief Complaint  Patient presents with  . Cough   (Consider location/radiation/quality/duration/timing/severity/associated sxs/prior Treatment) HPI URI HISTORY  Robin Montoya is a 28 y.o. female who complains of onset of cold symptoms for 21 days.  Have been using over-the-counter treatment, such as Mucinex and Robitussin which only helps a little bit.  No chills/sweats +  Fever  +  Nasal congestion No  Discolored Post-nasal drainage No sinus pain/pressure No sore throat  +  cough No wheezing + chest congestion No hemoptysis No shortness of breath No pleuritic pain  No itchy/red eyes No earache  No nausea No vomiting No abdominal pain No diarrhea  No skin rashes +  Fatigue No myalgias No headache   Denies chance of pregnancy, last menstrual period 04/13/2013. She is not breast-feeding Past Medical History  Diagnosis Date  . Hx of varicella   . Heart murmur     insufficient  . Depression    Past Surgical History  Procedure Laterality Date  . No past surgeries     Family History  Problem Relation Age of Onset  . Kidney Stones Mother   . Kidney Stones Father   . Depression Brother   . Bipolar disorder Brother   . Diabetes Maternal Grandfather   . Heart disease Maternal Grandfather     triple bypass  . Hypertension Maternal Grandfather   . Cancer Maternal Grandmother     colon  . Hypertension Maternal Grandmother    History  Substance Use Topics  . Smoking status: Never Smoker   . Smokeless tobacco: Never Used  . Alcohol Use: No   OB History   Grav Para Term Preterm Abortions TAB SAB Ect Mult Living   1 1 1  0 0 0 0 0 0 1     Review of Systems  All other systems reviewed and are negative.    Allergies  Review of patient's allergies indicates no known allergies.  Home Medications   Current Outpatient Rx  Name  Route  Sig   Dispense  Refill  . drospirenone-ethinyl estradiol (YAZ,GIANVI,LORYNA) 3-0.02 MG tablet   Oral   Take 1 tablet by mouth daily.         Marland Kitchen. azithromycin (ZITHROMAX Z-PAK) 250 MG tablet      Take 2 tablets on day one, then 1 tablet daily on days 2 through 5   1 each   0   . benzonatate (TESSALON) 100 MG capsule   Oral   Take 1 capsule (100 mg total) by mouth every 8 (eight) hours. As needed for cough   21 capsule   0   . Prenatal Vit-Fe Fumarate-FA (PRENATAL MULTIVITAMIN) TABS tablet   Oral   Take 1 tablet by mouth daily at 12 noon.         . ranitidine (ZANTAC) 150 MG capsule   Oral   Take 150 mg by mouth 2 (two) times daily.          BP 110/71  Pulse 67  Temp(Src) 98.5 F (36.9 C) (Oral)  Resp 18  Ht 5\' 3"  (1.6 m)  Wt 180 lb (81.647 kg)  BMI 31.89 kg/m2  SpO2 98%  LMP 04/13/2013 Physical Exam  Nursing note and vitals reviewed. Constitutional: She is oriented to person, place, and time. She appears well-developed and well-nourished. No distress.  HENT:  Head: Normocephalic and  atraumatic.  Right Ear: Tympanic membrane normal.  Left Ear: Tympanic membrane normal.  Nose: Nose normal.  Mouth/Throat: Oropharynx is clear and moist. No oropharyngeal exudate.  Eyes: Right eye exhibits no discharge. Left eye exhibits no discharge. No scleral icterus.  Neck: Neck supple.  Cardiovascular: Normal rate, regular rhythm and normal heart sounds.   Pulmonary/Chest: No respiratory distress. She has no decreased breath sounds. She has no wheezes. She has rhonchi (A few anterior). She has no rales.  Occasional cough noted.  Lymphadenopathy:    She has no cervical adenopathy.  Neurological: She is alert and oriented to person, place, and time.  Skin: Skin is warm and dry.    ED Course  Procedures (including critical care time) Labs Review Labs Reviewed - No data to display Imaging Review No results found.  EKG Interpretation    Date/Time:    Ventricular Rate:    PR  Interval:    QRS Duration:   QT Interval:    QTC Calculation:   R Axis:     Text Interpretation:              MDM   1. Acute bronchitis    Treatment options discussed, as well as risks, benefits, alternatives. Patient voiced understanding and agreement with the following plans:  Z Pak Tessalon pearls when necessary cough Other symptomatic care Discussed option of by mouth prednisone burst if cough persisted, but she declined that option at this time. Precautions discussed. Red flags discussed. Questions invited and answered. Patient voiced understanding and agreement.     Lajean Manes, MD 04/24/13 [phone]

## 2014-02-11 ENCOUNTER — Encounter: Payer: Self-pay | Admitting: Emergency Medicine

## 2018-04-03 ENCOUNTER — Emergency Department (HOSPITAL_BASED_OUTPATIENT_CLINIC_OR_DEPARTMENT_OTHER)
Admission: EM | Admit: 2018-04-03 | Discharge: 2018-04-03 | Disposition: A | Discharge status: Home / Self Care | Payer: BLUE CROSS/BLUE SHIELD | Attending: Emergency Medicine | Admitting: Emergency Medicine

## 2018-04-03 ENCOUNTER — Other Ambulatory Visit: Payer: Self-pay

## 2018-04-03 ENCOUNTER — Encounter (HOSPITAL_BASED_OUTPATIENT_CLINIC_OR_DEPARTMENT_OTHER): Payer: Self-pay | Admitting: *Deleted

## 2018-04-03 ENCOUNTER — Emergency Department (HOSPITAL_BASED_OUTPATIENT_CLINIC_OR_DEPARTMENT_OTHER): Payer: BLUE CROSS/BLUE SHIELD

## 2018-04-03 DIAGNOSIS — S82891A Other fracture of right lower leg, initial encounter for closed fracture: Secondary | ICD-10-CM

## 2018-04-03 DIAGNOSIS — Y9389 Activity, other specified: Secondary | ICD-10-CM | POA: Diagnosis not present

## 2018-04-03 DIAGNOSIS — S99911A Unspecified injury of right ankle, initial encounter: Secondary | ICD-10-CM | POA: Diagnosis present

## 2018-04-03 DIAGNOSIS — Y929 Unspecified place or not applicable: Secondary | ICD-10-CM | POA: Diagnosis not present

## 2018-04-03 DIAGNOSIS — S92154A Nondisplaced avulsion fracture (chip fracture) of right talus, initial encounter for closed fracture: Secondary | ICD-10-CM | POA: Diagnosis not present

## 2018-04-03 DIAGNOSIS — Z79899 Other long term (current) drug therapy: Secondary | ICD-10-CM | POA: Diagnosis not present

## 2018-04-03 DIAGNOSIS — W108XXA Fall (on) (from) other stairs and steps, initial encounter: Secondary | ICD-10-CM | POA: Insufficient documentation

## 2018-04-03 DIAGNOSIS — Y999 Unspecified external cause status: Secondary | ICD-10-CM | POA: Diagnosis not present

## 2018-04-03 MED ORDER — ACETAMINOPHEN ER 650 MG PO TBCR: 650.0000 mg | EXTENDED_RELEASE_TABLET | Freq: Three times a day (TID) | ORAL | 0 refills | Status: DC

## 2018-04-03 MED ORDER — HYDROCODONE-ACETAMINOPHEN 5-325 MG PO TABS: 1.0000 | ORAL_TABLET | Freq: Three times a day (TID) | ORAL | 0 refills | Status: AC | PRN

## 2018-04-03 MED ORDER — ACETAMINOPHEN 500 MG PO TABS
1000.0000 mg | ORAL_TABLET | Freq: Once | ORAL | Status: AC
  Administered 2018-04-03: 1000 mg via ORAL
  Filled 2018-04-03: qty 2

## 2018-04-03 NOTE — ED Provider Notes (Signed)
[facility] EMERGENCY DEPARTMENT Provider Note   CSN: [id number] Arrival date & time: 04/03/18  0710     History   Chief Complaint Chief Complaint  Patient presents with  . Fall    HPI Robin Montoya is a 32 y.o. female.  HPI  32 year old female comes in a chief complaint of fall. Patient reports that she was coming down the stairs, missed 1 of the steps and fell.  She heard a pop and had immediate pain.  Patient has not been able to bear weight.  Her pain is located in the right ankle laterally.  She denies any history of injury to that side.  Past Medical History:  Diagnosis Date  . Depression   . Heart murmur    insufficient  . Hx of varicella     Patient Active Problem List   Diagnosis Date Noted  . FATIGUE 09/01/2009  . ALLERGIC RHINITIS 01/06/2007  . ACNE NEC 01/06/2007    Past Surgical History:  Procedure Laterality Date  . NO PAST SURGERIES       OB History    Gravida  1   Para  1   Term  1   Preterm  0   AB  0   Living  1     SAB  0   TAB  0   Ectopic  0   Multiple  0   Live Births  1            Home Medications    Prior to Admission medications   Medication Sig Start Date End Date Taking? Authorizing Provider  acetaminophen (TYLENOL 8 HOUR) 650 MG CR tablet Take 1 tablet (650 mg total) by mouth every 8 (eight) hours. 04/03/18   Derwood KaplanNanavati, Pixie Burgener, MD  azithromycin (ZITHROMAX Z-PAK) 250 MG tablet Take 2 tablets on day one, then 1 tablet daily on days 2 through 5 04/24/13   Lajean ManesMassey, David, MD  benzonatate (TESSALON) 100 MG capsule Take 1 capsule (100 mg total) by mouth every 8 (eight) hours. As needed for cough 04/24/13   Lajean ManesMassey, David, MD  drospirenone-ethinyl estradiol Pierre Bali(YAZ,GIANVI,LORYNA) 3-0.02 MG tablet Take 1 tablet by mouth daily.    [provider]  HYDROcodone-acetaminophen (NORCO/VICODIN) 5-325 MG tablet Take 1 tablet by mouth every 8 (eight) hours as needed for up to 3 days for severe pain. 04/03/18  04/06/18  Derwood KaplanNanavati, Tachina Spoonemore, MD  Prenatal Vit-Fe Fumarate-FA (PRENATAL MULTIVITAMIN) TABS tablet Take 1 tablet by mouth daily at 12 noon.    [provider]  ranitidine (ZANTAC) 150 MG capsule Take 150 mg by mouth 2 (two) times daily.    [provider]    Family History Family History  Problem Relation Age of Onset  . Kidney Stones Mother   . Kidney Stones Father   . Depression Brother   . Bipolar disorder Brother   . Diabetes Maternal Grandfather   . Heart disease Maternal Grandfather        triple bypass  . Hypertension Maternal Grandfather   . Cancer Maternal Grandmother        colon  . Hypertension Maternal Grandmother     Social History Social History   Tobacco Use  . Smoking status: Never Smoker  . Smokeless tobacco: Never Used  Substance Use Topics  . Alcohol use: No  . Drug use: No     Allergies   Patient has no known allergies.   Review of Systems Review of Systems  Constitutional: Positive for activity  change.  Musculoskeletal: Positive for arthralgias.  Skin: Negative for wound.     Physical Exam Updated Vital Signs BP 133/78 (BP Location: Right Arm)   Pulse (!) 58   Temp 98.1 F (36.7 C) (Oral)   Resp 18   Ht 5\' 3"  (1.6 m)   Wt 71.7 kg   LMP 03/16/2018 (Exact Date)   SpO2 98%   BMI 27.99 kg/m   Physical Exam Vitals signs and nursing note reviewed.  Cardiovascular:     Rate and Rhythm: Normal rate.  Pulmonary:     Effort: Pulmonary effort is normal.  Abdominal:     Palpations: Abdomen is soft.  Musculoskeletal:        General: Swelling, tenderness and signs of injury present. No deformity.     Right lower leg: Edema present.     Comments: Right ankle has edema over the lateral aspect.  She has tenderness all around the lateral malleoli, and with plantar and dorsiflexion  Neurological:     Mental Status: She is alert.      ED Treatments / Results  Labs (all labs ordered are listed, but only abnormal results are  displayed) Labs Reviewed - No data to display  EKG None  Radiology Dg Ankle Complete Right  Result Date: 04/03/2018 CLINICAL DATA:  Pain following twisting injury EXAM: RIGHT ANKLE - COMPLETE 3+ VIEW COMPARISON:  None. FINDINGS: Frontal, oblique, and lateral views obtained. There is a small avulsion arising from lateral malleolus. No other fracture evident. No appreciable joint effusion. There is no appreciable joint space narrowing or erosion. Ankle mortise appears intact. IMPRESSION: Small avulsion arising from lateral malleolus. No other fracture evident. Ankle mortise appears intact. No appreciable arthropathy. Electronically Signed   By: Bretta BangWilliam  Woodruff III M.D.   On: 04/03/2018 08:09    Procedures Procedures (including critical care time)  Medications Ordered in ED Medications  acetaminophen (TYLENOL) tablet 1,000 mg (1,000 mg Oral Given 04/03/18 0830)     Initial Impression / Assessment and Plan / ED Course  I have reviewed the triage vital signs and the nursing notes.  Pertinent labs & imaging results that were available during my care of the patient were reviewed by me and considered in my medical decision making (see chart for details).     Patient comes in a chief complaint of fall.  She is noted to have avulsion fracture on the fibular side on her x-ray. Typically we would want patient to be nonweightbearing until she is cleared by orthopedics.  However patient states that she works [employer] and this is the busiest time of the year for them, therefore I spoke with Dr. Antony OdeaLucio, orthopedics who has cleared patient to use the cam walker boot, and weight bearing status as tolerated.  Patient has been advised to be walking only when necessary. She will follow-up with orthopedist in 1 week.  Final Clinical Impressions(s) / ED Diagnoses   Final diagnoses:  Closed avulsion fracture of right ankle, initial encounter    ED Discharge Orders         Ordered     HYDROcodone-acetaminophen (NORCO/VICODIN) 5-325 MG tablet  Every 8 hours PRN     04/03/18 0851    acetaminophen (TYLENOL 8 HOUR) 650 MG CR tablet  Every 8 hours     04/03/18 0851           Derwood KaplanNanavati, Urie Loughner, MD 04/03/18 [phone]

## 2018-04-03 NOTE — ED Triage Notes (Signed)
Pt fell down the stairs, hurt her right ankle..."she heard a crack".

## 2018-04-03 NOTE — Discharge Instructions (Signed)
We saw in the ER after you had a fall. Our results show that you have an avulsion fracture -which is a very small chip fracture of your fibula bone. There could be underlying ligament injury as well.  As discussed, weightbearing is allowed as tolerated  -but keep the walker boot on at all times if you can. When you remove the boot, ice the ankle for 5 to 10 minutes for the first 2 or 3 days.  All up with orthopedics in 1 week.

## 2018-11-02 LAB — OB RESULTS CONSOLE ABO/RH: RH Type: POSITIVE

## 2018-11-02 LAB — OB RESULTS CONSOLE HEPATITIS B SURFACE ANTIGEN: Hepatitis B Surface Ag: NEGATIVE

## 2018-11-02 LAB — OB RESULTS CONSOLE RUBELLA ANTIBODY, IGM: Rubella: IMMUNE

## 2018-11-02 LAB — OB RESULTS CONSOLE GC/CHLAMYDIA
Chlamydia: NEGATIVE
Gonorrhea: NEGATIVE

## 2018-11-02 LAB — OB RESULTS CONSOLE RPR: RPR: NONREACTIVE

## 2018-11-02 LAB — OB RESULTS CONSOLE ANTIBODY SCREEN: Antibody Screen: NEGATIVE

## 2018-11-02 LAB — OB RESULTS CONSOLE HIV ANTIBODY (ROUTINE TESTING): HIV: NONREACTIVE

## 2019-04-13 NOTE — L&D Delivery Note (Signed)
BPs just before delivery were 160-170s/90s.  Labetalol 20 and 40 given during delivery.  Magnesium sulfate started.   Patient was C/C/+3 and pushed for 1 minutes with epidural.    NSVD  female infant, Apgars 8,9, weight P.   The patient had no lacerations. Fundus was firm. EBL was expected amount. Placenta was delivered intact. Vagina was clear.  Delayed cord clamping done for 30-60 seconds while warming baby. Baby was vigorous and doing skin to skin with mother.  Robin Montoya

## 2019-05-08 ENCOUNTER — Other Ambulatory Visit: Payer: Self-pay | Admitting: Obstetrics and Gynecology

## 2019-05-09 ENCOUNTER — Telehealth (HOSPITAL_COMMUNITY): Payer: Self-pay | Admitting: *Deleted

## 2019-05-09 ENCOUNTER — Encounter (HOSPITAL_COMMUNITY): Payer: Self-pay | Admitting: *Deleted

## 2019-05-09 NOTE — Telephone Encounter (Signed)
Preadmission screen  

## 2019-05-11 ENCOUNTER — Encounter (HOSPITAL_COMMUNITY): Payer: Self-pay | Admitting: *Deleted

## 2019-05-11 LAB — OB RESULTS CONSOLE GBS: GBS: POSITIVE

## 2019-05-16 ENCOUNTER — Other Ambulatory Visit (HOSPITAL_COMMUNITY)
Admission: RE | Admit: 2019-05-16 | Discharge: 2019-05-16 | Disposition: A | Discharge status: Home / Self Care | Payer: Commercial Managed Care - PPO | Source: Ambulatory Visit | Attending: Obstetrics and Gynecology | Admitting: Obstetrics and Gynecology

## 2019-05-16 LAB — SARS CORONAVIRUS 2 (TAT 6-24 HRS): SARS Coronavirus 2: NEGATIVE

## 2019-05-18 ENCOUNTER — Inpatient Hospital Stay (HOSPITAL_COMMUNITY): Payer: Commercial Managed Care - PPO | Admitting: Anesthesiology

## 2019-05-18 ENCOUNTER — Inpatient Hospital Stay (HOSPITAL_COMMUNITY): Payer: Commercial Managed Care - PPO

## 2019-05-18 ENCOUNTER — Inpatient Hospital Stay (HOSPITAL_COMMUNITY)
Admission: AD | Admit: 2019-05-18 | Discharge: 2019-05-20 | DRG: 807 | Disposition: A | Discharge status: Home / Self Care | Payer: Commercial Managed Care - PPO | Attending: Obstetrics and Gynecology | Admitting: Obstetrics and Gynecology

## 2019-05-18 ENCOUNTER — Encounter (HOSPITAL_COMMUNITY): Payer: Self-pay | Admitting: Obstetrics and Gynecology

## 2019-05-18 ENCOUNTER — Other Ambulatory Visit: Payer: Self-pay

## 2019-05-18 DIAGNOSIS — Z20822 Contact with and (suspected) exposure to covid-19: Secondary | ICD-10-CM | POA: Diagnosis present

## 2019-05-18 DIAGNOSIS — O99824 Streptococcus B carrier state complicating childbirth: Secondary | ICD-10-CM | POA: Diagnosis present

## 2019-05-18 DIAGNOSIS — O99344 Other mental disorders complicating childbirth: Secondary | ICD-10-CM | POA: Diagnosis present

## 2019-05-18 DIAGNOSIS — O134 Gestational [pregnancy-induced] hypertension without significant proteinuria, complicating childbirth: Secondary | ICD-10-CM | POA: Diagnosis present

## 2019-05-18 DIAGNOSIS — F329 Major depressive disorder, single episode, unspecified: Secondary | ICD-10-CM | POA: Diagnosis present

## 2019-05-18 DIAGNOSIS — Z3A39 39 weeks gestation of pregnancy: Secondary | ICD-10-CM

## 2019-05-18 DIAGNOSIS — O26893 Other specified pregnancy related conditions, third trimester: Secondary | ICD-10-CM | POA: Diagnosis present

## 2019-05-18 LAB — COMPREHENSIVE METABOLIC PANEL
ALT: 10 U/L (ref 0–44)
AST: 14 U/L — ABNORMAL LOW (ref 15–41)
Albumin: 2.7 g/dL — ABNORMAL LOW (ref 3.5–5.0)
Alkaline Phosphatase: 159 U/L — ABNORMAL HIGH (ref 38–126)
Anion gap: 11 (ref 5–15)
BUN: <5 mg/dL — ABNORMAL LOW (ref 6–20)
CO2: 21 mmol/L — ABNORMAL LOW (ref 22–32)
Calcium: 8.6 mg/dL — ABNORMAL LOW (ref 8.9–10.3)
Chloride: 105 mmol/L (ref 98–111)
Creatinine, Ser: 0.76 mg/dL (ref 0.44–1.00)
GFR calc Af Amer: >60 mL/min (ref >60)
GFR calc non Af Amer: >60 mL/min (ref >60)
Glucose, Bld: 83 mg/dL (ref 70–99)
Potassium: 3.7 mmol/L (ref 3.5–5.1)
Sodium: 137 mmol/L (ref 135–145)
Total Bilirubin: 0.5 mg/dL (ref 0.3–1.2)
Total Protein: 5.9 g/dL — ABNORMAL LOW (ref 6.5–8.1)

## 2019-05-18 LAB — ABO/RH: ABO/RH(D): O POS

## 2019-05-18 LAB — CBC
HCT: 34.6 % — ABNORMAL LOW (ref 36.0–46.0)
Hemoglobin: 11.1 g/dL — ABNORMAL LOW (ref 12.0–15.0)
MCH: 27.3 pg (ref 26.0–34.0)
MCHC: 32.1 g/dL (ref 30.0–36.0)
MCV: 85 fL (ref 80.0–100.0)
Platelets: 312 10*3/uL (ref 150–400)
RBC: 4.07 MIL/uL (ref 3.87–5.11)
RDW: 13.5 % (ref 11.5–15.5)
WBC: 8.1 10*3/uL (ref 4.0–10.5)
nRBC: 0 % (ref 0.0–0.2)

## 2019-05-18 LAB — TYPE AND SCREEN
ABO/RH(D): O POS
Antibody Screen: NEGATIVE

## 2019-05-18 LAB — PROTEIN / CREATININE RATIO, URINE
Creatinine, Urine: 28.04 mg/dL
Total Protein, Urine: <6 mg/dL

## 2019-05-18 LAB — RPR: RPR Ser Ql: NONREACTIVE

## 2019-05-18 MED ORDER — DIPHENHYDRAMINE HCL 25 MG PO CAPS: 25.0000 mg | ORAL_CAPSULE | Freq: Four times a day (QID) | ORAL | Status: DC | PRN

## 2019-05-18 MED ORDER — FENTANYL-BUPIVACAINE-NACL 0.5-0.125-0.9 MG/250ML-% EP SOLN
12.0000 mL/h | EPIDURAL | Status: DC | PRN
  Filled 2019-05-18: qty 250

## 2019-05-18 MED ORDER — OXYTOCIN 40 UNITS IN NORMAL SALINE INFUSION - SIMPLE MED: 2.5000 [IU]/h | INTRAVENOUS | Status: DC

## 2019-05-18 MED ORDER — PRENATAL MULTIVITAMIN CH
1.0000 | ORAL_TABLET | Freq: Every day | ORAL | Status: DC
  Administered 2019-05-19: 12:00:00 1 via ORAL
  Filled 2019-05-18: qty 1

## 2019-05-18 MED ORDER — DIPHENHYDRAMINE HCL 50 MG/ML IJ SOLN: 12.5000 mg | INTRAMUSCULAR | Status: DC | PRN

## 2019-05-18 MED ORDER — MAGNESIUM SULFATE 40 GM/1000ML IV SOLN
2.0000 g/h | INTRAVENOUS | Status: DC
  Administered 2019-05-18 – 2019-05-19 (×2): 2 g/h via INTRAVENOUS
  Filled 2019-05-18: qty 1000

## 2019-05-18 MED ORDER — LABETALOL HCL 5 MG/ML IV SOLN
40.0000 mg | INTRAVENOUS | Status: DC | PRN
  Administered 2019-05-18 – 2019-05-20 (×2): 40 mg via INTRAVENOUS
  Filled 2019-05-18 (×2): qty 8

## 2019-05-18 MED ORDER — DIBUCAINE (PERIANAL) 1 % EX OINT: 1.0000 "application " | TOPICAL_OINTMENT | CUTANEOUS | Status: DC | PRN

## 2019-05-18 MED ORDER — MEASLES, MUMPS & RUBELLA VAC IJ SOLR: 0.5000 mL | Freq: Once | INTRAMUSCULAR | Status: DC

## 2019-05-18 MED ORDER — SODIUM CHLORIDE 0.9 % IV SOLN: 250.0000 mL | INTRAVENOUS | Status: DC | PRN

## 2019-05-18 MED ORDER — METHYLERGONOVINE MALEATE 0.2 MG PO TABS: 0.2000 mg | ORAL_TABLET | ORAL | Status: DC | PRN

## 2019-05-18 MED ORDER — ONDANSETRON HCL 4 MG/2ML IJ SOLN: 4.0000 mg | Freq: Four times a day (QID) | INTRAMUSCULAR | Status: DC | PRN

## 2019-05-18 MED ORDER — LABETALOL HCL 5 MG/ML IV SOLN
INTRAVENOUS | Status: AC
  Filled 2019-05-18: qty 4

## 2019-05-18 MED ORDER — FERROUS SULFATE 325 (65 FE) MG PO TABS
325.0000 mg | ORAL_TABLET | Freq: Two times a day (BID) | ORAL | Status: DC
  Administered 2019-05-18 – 2019-05-20 (×4): 325 mg via ORAL
  Filled 2019-05-18 (×4): qty 1

## 2019-05-18 MED ORDER — TERBUTALINE SULFATE 1 MG/ML IJ SOLN: 0.2500 mg | Freq: Once | INTRAMUSCULAR | Status: DC | PRN

## 2019-05-18 MED ORDER — SENNOSIDES-DOCUSATE SODIUM 8.6-50 MG PO TABS
2.0000 | ORAL_TABLET | ORAL | Status: DC
  Administered 2019-05-19 (×2): 2 via ORAL
  Filled 2019-05-18 (×2): qty 2

## 2019-05-18 MED ORDER — OXYTOCIN BOLUS FROM INFUSION
500.0000 mL | Freq: Once | INTRAVENOUS | Status: AC
  Administered 2019-05-18: 15:00:00 500 mL via INTRAVENOUS

## 2019-05-18 MED ORDER — BENZOCAINE-MENTHOL 20-0.5 % EX AERO
1.0000 "application " | INHALATION_SPRAY | CUTANEOUS | Status: DC | PRN
  Administered 2019-05-18: 1 via TOPICAL
  Filled 2019-05-18: qty 56

## 2019-05-18 MED ORDER — LIDOCAINE HCL (PF) 1 % IJ SOLN: 30.0000 mL | INTRAMUSCULAR | Status: DC | PRN

## 2019-05-18 MED ORDER — TETANUS-DIPHTH-ACELL PERTUSSIS 5-2.5-18.5 LF-MCG/0.5 IM SUSP: 0.5000 mL | Freq: Once | INTRAMUSCULAR | Status: DC

## 2019-05-18 MED ORDER — PENICILLIN G POT IN DEXTROSE 60000 UNIT/ML IV SOLN
3.0000 10*6.[IU] | INTRAVENOUS | Status: DC
  Administered 2019-05-18: 12:00:00 3 10*6.[IU] via INTRAVENOUS
  Filled 2019-05-18: qty 50

## 2019-05-18 MED ORDER — ONDANSETRON HCL 4 MG/2ML IJ SOLN: 4.0000 mg | INTRAMUSCULAR | Status: DC | PRN

## 2019-05-18 MED ORDER — LIDOCAINE HCL (PF) 1 % IJ SOLN
INTRAMUSCULAR | Status: DC | PRN
  Administered 2019-05-18: 6 mL via EPIDURAL

## 2019-05-18 MED ORDER — METHYLERGONOVINE MALEATE 0.2 MG/ML IJ SOLN: 0.2000 mg | INTRAMUSCULAR | Status: DC | PRN

## 2019-05-18 MED ORDER — LACTATED RINGERS IV SOLN: 500.0000 mL | Freq: Once | INTRAVENOUS | Status: DC

## 2019-05-18 MED ORDER — IBUPROFEN 800 MG PO TABS
800.0000 mg | ORAL_TABLET | Freq: Three times a day (TID) | ORAL | Status: DC
  Administered 2019-05-18 – 2019-05-20 (×6): 800 mg via ORAL
  Filled 2019-05-18 (×6): qty 1

## 2019-05-18 MED ORDER — SODIUM CHLORIDE 0.9% FLUSH
3.0000 mL | Freq: Two times a day (BID) | INTRAVENOUS | Status: DC
  Administered 2019-05-19: 22:00:00 3 mL via INTRAVENOUS

## 2019-05-18 MED ORDER — MAGNESIUM SULFATE BOLUS VIA INFUSION
4.0000 g | Freq: Once | INTRAVENOUS | Status: AC
  Administered 2019-05-18: 15:00:00 4 g via INTRAVENOUS

## 2019-05-18 MED ORDER — PHENYLEPHRINE 40 MCG/ML (10ML) SYRINGE FOR IV PUSH (FOR BLOOD PRESSURE SUPPORT)
80.0000 ug | PREFILLED_SYRINGE | INTRAVENOUS | Status: DC | PRN
  Filled 2019-05-18: qty 10

## 2019-05-18 MED ORDER — PHENYLEPHRINE 40 MCG/ML (10ML) SYRINGE FOR IV PUSH (FOR BLOOD PRESSURE SUPPORT): 80.0000 ug | PREFILLED_SYRINGE | INTRAVENOUS | Status: DC | PRN

## 2019-05-18 MED ORDER — ACETAMINOPHEN 325 MG PO TABS: 650.0000 mg | ORAL_TABLET | ORAL | Status: DC | PRN

## 2019-05-18 MED ORDER — LABETALOL HCL 5 MG/ML IV SOLN: 80.0000 mg | INTRAVENOUS | Status: DC | PRN

## 2019-05-18 MED ORDER — LACTATED RINGERS IV SOLN: INTRAVENOUS | Status: DC

## 2019-05-18 MED ORDER — FAMOTIDINE 20 MG PO TABS
20.0000 mg | ORAL_TABLET | Freq: Every day | ORAL | Status: DC
  Administered 2019-05-18 – 2019-05-19 (×2): 20 mg via ORAL
  Filled 2019-05-18 (×2): qty 1

## 2019-05-18 MED ORDER — ONDANSETRON HCL 4 MG PO TABS: 4.0000 mg | ORAL_TABLET | ORAL | Status: DC | PRN

## 2019-05-18 MED ORDER — OXYTOCIN 40 UNITS IN NORMAL SALINE INFUSION - SIMPLE MED
1.0000 m[IU]/min | INTRAVENOUS | Status: DC
  Administered 2019-05-18: 08:00:00 2 m[IU]/min via INTRAVENOUS
  Filled 2019-05-18: qty 1000

## 2019-05-18 MED ORDER — MAGNESIUM SULFATE 40 GM/1000ML IV SOLN
INTRAVENOUS | Status: AC
  Filled 2019-05-18: qty 1000

## 2019-05-18 MED ORDER — FENTANYL-BUPIVACAINE-NACL 0.5-0.125-0.9 MG/250ML-% EP SOLN: 12.0000 mL/h | EPIDURAL | Status: DC | PRN

## 2019-05-18 MED ORDER — SODIUM CHLORIDE 0.9% FLUSH: 3.0000 mL | INTRAVENOUS | Status: DC | PRN

## 2019-05-18 MED ORDER — WITCH HAZEL-GLYCERIN EX PADS: 1.0000 "application " | MEDICATED_PAD | CUTANEOUS | Status: DC | PRN

## 2019-05-18 MED ORDER — SODIUM CHLORIDE (PF) 0.9 % IJ SOLN
INTRAMUSCULAR | Status: DC | PRN
  Administered 2019-05-18: 12 mL/h via EPIDURAL

## 2019-05-18 MED ORDER — LACTATED RINGERS IV SOLN: 500.0000 mL | INTRAVENOUS | Status: DC | PRN

## 2019-05-18 MED ORDER — SERTRALINE HCL 50 MG PO TABS
50.0000 mg | ORAL_TABLET | Freq: Every day | ORAL | Status: DC
  Administered 2019-05-18 – 2019-05-19 (×2): 50 mg via ORAL
  Filled 2019-05-18 (×2): qty 1

## 2019-05-18 MED ORDER — OXYCODONE-ACETAMINOPHEN 5-325 MG PO TABS: 2.0000 | ORAL_TABLET | ORAL | Status: DC | PRN

## 2019-05-18 MED ORDER — SIMETHICONE 80 MG PO CHEW: 80.0000 mg | CHEWABLE_TABLET | ORAL | Status: DC | PRN

## 2019-05-18 MED ORDER — LABETALOL HCL 5 MG/ML IV SOLN
20.0000 mg | INTRAVENOUS | Status: DC | PRN
  Administered 2019-05-18 – 2019-05-20 (×2): 20 mg via INTRAVENOUS
  Filled 2019-05-18: qty 4

## 2019-05-18 MED ORDER — SODIUM CHLORIDE 0.9 % IV SOLN
5.0000 10*6.[IU] | Freq: Once | INTRAVENOUS | Status: AC
  Administered 2019-05-18: 08:00:00 5 10*6.[IU] via INTRAVENOUS
  Filled 2019-05-18: qty 5

## 2019-05-18 MED ORDER — HYDRALAZINE HCL 20 MG/ML IJ SOLN: 10.0000 mg | INTRAMUSCULAR | Status: DC | PRN

## 2019-05-18 MED ORDER — COCONUT OIL OIL: 1.0000 "application " | TOPICAL_OIL | Status: DC | PRN

## 2019-05-18 MED ORDER — EPHEDRINE 5 MG/ML INJ: 10.0000 mg | INTRAVENOUS | Status: DC | PRN

## 2019-05-18 MED ORDER — SOD CITRATE-CITRIC ACID 500-334 MG/5ML PO SOLN: 30.0000 mL | ORAL | Status: DC | PRN

## 2019-05-18 MED ORDER — MAGNESIUM HYDROXIDE 400 MG/5ML PO SUSP: 30.0000 mL | ORAL | Status: DC | PRN

## 2019-05-18 MED ORDER — ZOLPIDEM TARTRATE 5 MG PO TABS: 5.0000 mg | ORAL_TABLET | Freq: Every evening | ORAL | Status: DC | PRN

## 2019-05-18 MED ORDER — OXYTOCIN 40 UNITS IN NORMAL SALINE INFUSION - SIMPLE MED: 1.0000 m[IU]/min | INTRAVENOUS | Status: DC

## 2019-05-18 NOTE — Anesthesia Preprocedure Evaluation (Signed)
Anesthesia Evaluation  Patient identified by MRN, date of birth, ID band Patient awake    Reviewed: Allergy & Precautions, H&P , NPO status , Patient's Chart, lab work & pertinent test results, reviewed documented beta blocker date and time   Airway Mallampati: I  TM Distance: >3 FB Neck ROM: full    Dental no notable dental hx. (+) Dental Advisory Given   Pulmonary neg pulmonary ROS,    Pulmonary exam normal breath sounds clear to auscultation       Cardiovascular negative cardio ROS Normal cardiovascular exam Rhythm:regular Rate:Normal     Neuro/Psych negative neurological ROS  negative psych ROS   GI/Hepatic negative GI ROS, Neg liver ROS,   Endo/Other  negative endocrine ROS  Renal/GU negative Renal ROS  negative genitourinary   Musculoskeletal   Abdominal   Peds  Hematology negative hematology ROS (+)   Anesthesia Other Findings   Reproductive/Obstetrics (+) Pregnancy                             Anesthesia Physical Anesthesia Plan  ASA: II  Anesthesia Plan: Epidural   Post-op Pain Management:    Induction:   PONV Risk Score and Plan:   Airway Management Planned:   Additional Equipment:   Intra-op Plan:   Post-operative Plan:   Informed Consent: I have reviewed the patients History and Physical, chart, labs and discussed the procedure including the risks, benefits and alternatives for the proposed anesthesia with the patient or authorized representative who has indicated his/her understanding and acceptance.       Plan Discussed with: Anesthesiologist  Anesthesia Plan Comments:         Anesthesia Quick Evaluation

## 2019-05-18 NOTE — Anesthesia Procedure Notes (Signed)
Epidural Patient location during procedure: OB Start time: 05/18/2019 11:30 AM End time: 05/18/2019 11:35 AM  Staffing Anesthesiologist: Bethena Midget, MD  Preanesthetic Checklist Completed: patient identified, IV checked, site marked, risks and benefits discussed, surgical consent, monitors and equipment checked, pre-op evaluation and timeout performed  Epidural Patient position: sitting Prep: DuraPrep and site prepped and draped Patient monitoring: continuous pulse ox and blood pressure Approach: midline Location: L3-L4 Injection technique: LOR air  Needle:  Needle type: Tuohy  Needle gauge: 17 G Needle length: 9 cm and 9 Needle insertion depth: 6 cm Catheter type: closed end flexible Catheter size: 19 Gauge Catheter at skin depth: 11 cm Test dose: negative  Assessment Events: blood not aspirated, injection not painful, no injection resistance, no paresthesia and negative IV test

## 2019-05-18 NOTE — H&P (Signed)
34 y.o. [redacted]w[redacted]d  G3P1011 comes in for induction at term.  Otherwise has good fetal movement and no bleeding.  Past Medical History:  Diagnosis Date  . Depression   . Heart murmur    insufficient  . Hx of varicella     Past Surgical History:  Procedure Laterality Date  . NO PAST SURGERIES      OB History  Gravida Para Term Preterm AB Living  3 1 1  0 1 1  SAB TAB Ectopic Multiple Live Births  1 0 0 0 1    # Outcome Date GA Lbr Len/2nd Weight Sex Delivery Anes PTL Lv  3 Current           2 Term 02/07/13 [redacted]w[redacted]d 08:48 / 00:54 3266 g F Vag-Spont EPI  LIV  1 SAB             Social History   Socioeconomic History  . Marital status: Married    Spouse name: Not on file  . Number of children: Not on file  . Years of education: Not on file  . Highest education level: Not on file  Occupational History  . Not on file  Tobacco Use  . Smoking status: Never Smoker  . Smokeless tobacco: Never Used  Substance and Sexual Activity  . Alcohol use: No  . Drug use: No  . Sexual activity: Yes  Other Topics Concern  . Not on file  Social History Narrative  . Not on file   Social Determinants of Health   Financial Resource Strain:   . Difficulty of Paying Living Expenses: Not on file  Food Insecurity:   . Worried About Charity fundraiser in the Last Year: Not on file  . Ran Out of Food in the Last Year: Not on file  Transportation Needs:   . Lack of Transportation (Medical): Not on file  . Lack of Transportation (Non-Medical): Not on file  Physical Activity:   . Days of Exercise per Week: Not on file  . Minutes of Exercise per Session: Not on file  Stress:   . Feeling of Stress : Not on file  Social Connections:   . Frequency of Communication with Friends and Family: Not on file  . Frequency of Social Gatherings with Friends and Family: Not on file  . Attends Religious Services: Not on file  . Active Member of Clubs or Organizations: Not on file  . Attends Archivist  Meetings: Not on file  . Marital Status: Not on file  Intimate Partner Violence:   . Fear of Current or Ex-Partner: Not on file  . Emotionally Abused: Not on file  . Physically Abused: Not on file  . Sexually Abused: Not on file   Patient has no known allergies.    Prenatal Transfer Tool  Maternal Diabetes: No Genetic Screening: Normal Maternal Ultrasounds/Referrals: Normal Fetal Ultrasounds or other Referrals:  None Maternal Substance Abuse:  No Significant Maternal Medications:  None Significant Maternal Lab Results: Group B Strep positive- in urine  Other PNC: uncomplicated.    Vitals:   05/18/19 0749 05/18/19 0757 05/18/19 0822 05/18/19 0856  BP: (!) 158/93  135/83 (!) 150/100  Pulse: 79  78 80  Temp: 98.7 F (37.1 C)     TempSrc: Oral     Weight:  87.9 kg    Height:  5\' 3"  (1.6 m)      Lungs/Cor:  NAD Abdomen:  soft, gravid Ex:  no cords, erythema SVE:  1.5/50/-2  FHTs:  140s, good STV, NST R; Cat 1 tracing. Toco:  q 3-7 min   A/P   Term induction.   GBS POS!  PCN.  [place].  Loney Laurence

## 2019-05-18 NOTE — Progress Notes (Signed)
Called Dr. Tacy Dura to update him on pt's status since delivery. Patient's blood pressure increased just before, during, and just after delivery, for which IV Labetalol was given immediately after delivery. Dr. Henderson Cloud also ordered patient to be started on Magnesium Sulfate, which was done at time of delivery. Patient has no other symptoms. Asked him if he wanted a repeat CBC before this RN removes Epidural. He asked platelet count from this AM. Read result to him. He stated may pull Epidural catheter without repeating CBC.

## 2019-05-18 NOTE — Progress Notes (Signed)
Results for orders placed or performed during the hospital encounter of 05/18/19 (from the past 24 hour(s))  CBC     Status: Abnormal   Collection Time: 05/18/19  7:53 AM  Result Value Ref Range   WBC 8.1 4.0 - 10.5 K/uL   RBC 4.07 3.87 - 5.11 MIL/uL   Hemoglobin 11.1 (L) 12.0 - 15.0 g/dL   HCT 02.5 (L) 85.2 - 77.8 %   MCV 85.0 80.0 - 100.0 fL   MCH 27.3 26.0 - 34.0 pg   MCHC 32.1 30.0 - 36.0 g/dL   RDW 24.2 35.3 - 61.4 %   Platelets 312 150 - 400 K/uL   nRBC 0.0 0.0 - 0.2 %  RPR     Status: None   Collection Time: 05/18/19  7:53 AM  Result Value Ref Range   RPR Ser Ql NON REACTIVE NON REACTIVE  Type and screen     Status: None   Collection Time: 05/18/19  7:53 AM  Result Value Ref Range   ABO/RH(D) O POS    Antibody Screen NEG    Sample Expiration      05/21/2019,2359 Performed at [facility], [address], [place], [place] [address]   ABO/Rh     Status: None   Collection Time: 05/18/19  7:53 AM  Result Value Ref Range   ABO/RH(D)      O POS Performed at [facility], [address], [place], [place] [address]   Protein / creatinine ratio, urine     Status: None   Collection Time: 05/18/19 10:01 AM  Result Value Ref Range   Creatinine, Urine 28.04 mg/dL   Total Protein, Urine <6 mg/dL   Protein Creatinine Ratio        0.00 - 0.15 mg/mg[Cre]  Comprehensive metabolic panel     Status: Abnormal   Collection Time: 05/18/19 10:35 AM  Result Value Ref Range   Sodium 137 135 - 145 mmol/L   Potassium 3.7 3.5 - 5.1 mmol/L   Chloride 105 98 - 111 mmol/L   CO2 21 (L) 22 - 32 mmol/L   Glucose, Bld 83 70 - 99 mg/dL   BUN <5 (L) 6 - 20 mg/dL   Creatinine, Ser 6.19 0.44 - 1.00 mg/dL   Calcium 8.6 (L) 8.9 - 10.3 mg/dL   Total Protein 5.9 (L) 6.5 - 8.1 g/dL   Albumin 2.7 (L) 3.5 - 5.0 g/dL   AST 14 (L) 15 - 41 U/L   ALT 10 0 - 44 U/L   Alkaline Phosphatase 159 (H) 38 - 126 U/L   Total Bilirubin 0.5 0.3 - 1.2 mg/dL   GFR calc non Af Amer >60 >60 mL/min   GFR  calc Af Amer >60 >60 mL/min   Anion gap 11 5 - 15

## 2019-05-19 ENCOUNTER — Encounter (HOSPITAL_COMMUNITY): Payer: Self-pay | Admitting: Certified Registered Nurse Anesthetist

## 2019-05-19 LAB — CBC
HCT: 32.4 % — ABNORMAL LOW (ref 36.0–46.0)
Hemoglobin: 10.7 g/dL — ABNORMAL LOW (ref 12.0–15.0)
MCH: 27.9 pg (ref 26.0–34.0)
MCHC: 33 g/dL (ref 30.0–36.0)
MCV: 84.4 fL (ref 80.0–100.0)
Platelets: 283 10*3/uL (ref 150–400)
RBC: 3.84 MIL/uL — ABNORMAL LOW (ref 3.87–5.11)
RDW: 13.7 % (ref 11.5–15.5)
WBC: 10.4 10*3/uL (ref 4.0–10.5)
nRBC: 0 % (ref 0.0–0.2)

## 2019-05-19 NOTE — Anesthesia Postprocedure Evaluation (Signed)
Anesthesia Post Note  Patient: Robin Montoya  Procedure(s) Performed: AN AD HOC LABOR EPIDURAL     Patient location during evaluation: OB High Risk Anesthesia Type: Epidural Level of consciousness: awake, awake and alert and oriented Pain management: pain level controlled Vital Signs Assessment: post-procedure vital signs reviewed and stable Cardiovascular status: blood pressure returned to baseline Postop Assessment: no headache and no backache Anesthetic complications: no Comments: Per telephone conversation    Last Vitals:  Vitals:   05/19/19 0410 05/19/19 0449  BP: (!) 155/69 133/77  Pulse: 67 61  Resp: 17 18  Temp:  36.5 C  SpO2: 100% 100%    Last Pain:  Vitals:   05/19/19 0449  TempSrc: Oral  PainSc:    Pain Goal:                   Trellis Paganini

## 2019-05-19 NOTE — Anesthesia Postprocedure Evaluation (Signed)
Anesthesia Post Note  Patient: Robin Montoya  Procedure(s) Performed: AN AD HOC LABOR EPIDURAL     Anesthesia Post Evaluation  Last Vitals:  Vitals:   05/19/19 0410 05/19/19 0449  BP: (!) 155/69 133/77  Pulse: 67 61  Resp: 17 18  Temp:  36.5 C  SpO2: 100% 100%    Last Pain:  Vitals:   05/19/19 0449  TempSrc: Oral  PainSc:                  [name]

## 2019-05-19 NOTE — Progress Notes (Signed)
Patient is eating, ambulating, voiding.  Pain control is good.  Vitals:   05/19/19 0030 05/19/19 0410 05/19/19 0449 05/19/19 0859  BP: 109/65 (!) 155/69 133/77 (!) 144/79  Pulse: 74 67 61 66  Resp: 17 17 18 18   Temp:   97.7 F (36.5 C) 97.8 F (36.6 C)  TempSrc:   Oral Oral  SpO2: 97% 100% 100% 99%  Weight:      Height:        Fundus firm Perineum without swelling.  Lab Results  Component Value Date   WBC 10.4 05/19/2019   HGB 10.7 (L) 05/19/2019   HCT 32.4 (L) 05/19/2019   MCV 84.4 05/19/2019   PLT 283 05/19/2019    --/--/O POS, O POS Performed at [facility], [address], [place], [place]  (02/05 [address])/[place]  A/P Post partum day 1 TSVD but severe GHTN at time of delivery.  On Magnesium for 24 hours- will be able to stop at about 3 pm today.  BPs currently normal and not needing additional meds.   Hx of depression- pt has been on Zoloft and it is ordered here. Routine care otherwise.  Expect d/c tomorrow.    11-24-1989

## 2019-05-19 NOTE — Progress Notes (Signed)
MOB was referred for history of depression/anxiety. * Referral screened out by Clinical Social Worker because none of the following criteria appear to apply: ~ History of anxiety/depression during this pregnancy, or of post-partum depression following prior delivery. ~ Diagnosis of anxiety and/or depression within last 3 years.  OR * MOB's symptoms currently being treated with medication and/or therapy. Per further chart review, MOB in Zoloft for depression     CSW aware that MOB scored 0 in [place] with no concerns to CSW at this time.      Robin Montoya, MSW, LCSW [facility] [phone]

## 2019-05-20 MED ORDER — NIFEDIPINE ER OSMOTIC RELEASE 30 MG PO TB24
30.0000 mg | ORAL_TABLET | Freq: Every day | ORAL | Status: DC
  Administered 2019-05-20: 11:00:00 30 mg via ORAL
  Filled 2019-05-20: qty 1

## 2019-05-20 MED ORDER — NIFEDIPINE ER 30 MG PO TB24: 30.0000 mg | ORAL_TABLET | Freq: Every day | ORAL | 4 refills | Status: DC

## 2019-05-20 MED ORDER — IBUPROFEN 800 MG PO TABS: 800.0000 mg | ORAL_TABLET | Freq: Three times a day (TID) | ORAL | 0 refills | Status: DC

## 2019-05-20 NOTE — Progress Notes (Signed)
Dr. Henderson Cloud updated on pt. BP:  1200=151/88, 1445=153/82.  Per Dr. Henderson Cloud..The current medical regimen is effective;  continue present plan and medications. To monitor BP's and re-update @ 1600.

## 2019-05-20 NOTE — Discharge Summary (Signed)
Obstetric Discharge Summary Reason for Admission: induction of labor Prenatal Procedures: none Intrapartum Procedures: spontaneous vaginal delivery Postpartum Procedures: none Complications-Operative and Postpartum: none/ severe GHTN Hemoglobin  Date Value Ref Range Status  05/19/2019 10.7 (L) 12.0 - 15.0 g/dL Final   HCT  Date Value Ref Range Status  05/19/2019 32.4 (L) 36.0 - 46.0 % Final    Discharge Diagnoses: Term Pregnancy-delivered and Preelampsia  Discharge Information: Date: 05/20/2019 Activity: pelvic rest Diet: routine Medications: Ibuprofen and procardia 30 mg XL Condition: stable Instructions: refer to practice specific booklet Discharge to: home Follow-up Information    Carrington Clamp, MD Follow up in 2 week(s).   Specialty: Obstetrics and Gynecology Contact information: [address] [place] [address] [phone]           Newborn Data: Live born female  Birth Weight: 6 lb 13.5 oz (3105 g) APGAR: 8, 9  Newborn Delivery   Birth date/time: 05/18/2019 15:00:00 Delivery type: Vaginal, Spontaneous      Home with mother.  Loney Laurence 05/20/2019, 9:50 AM

## 2019-05-20 NOTE — Progress Notes (Signed)
Patient is eating, ambulating, voiding.  Pain control is good.  Vitals:   05/20/19 0510 05/20/19 0815 05/20/19 0825 05/20/19 0845  BP: (!) 151/82 (!) 161/86 (!) 164/72 136/77  Pulse: 60 69 (!) 58 (!) 59  Resp:  16    Temp:  98.2 F (36.8 C)    TempSrc:  Oral    SpO2:  100%    Weight:      Height:       Lungs clear. Fundus firm Perineum without swelling.  Lab Results  Component Value Date   WBC 10.4 05/19/2019   HGB 10.7 (L) 05/19/2019   HCT 32.4 (L) 05/19/2019   MCV 84.4 05/19/2019   PLT 283 05/19/2019    --/--/O POS, O POS Performed at [facility], [address] [place], [place] [address]  (02/05 [address])/[place]  A/P Post partum day 2- off magnesium since yesterday afternoon.  Has had a few severe range BPs since then,  Will start procardia 30 mg XL and see if that corrects.  Routine care.  Expect d/c this afternoon.    Robin Montoya

## 2019-12-28 IMAGING — CR DG ANKLE COMPLETE 3+V*R*
3 series · 3 of 3 positions shown · non-contrast
Comparison: None.

CLINICAL DATA: Pain following twisting injury

EXAM:
RIGHT ANKLE - COMPLETE 3+ VIEW

[t ankle joint ap right]
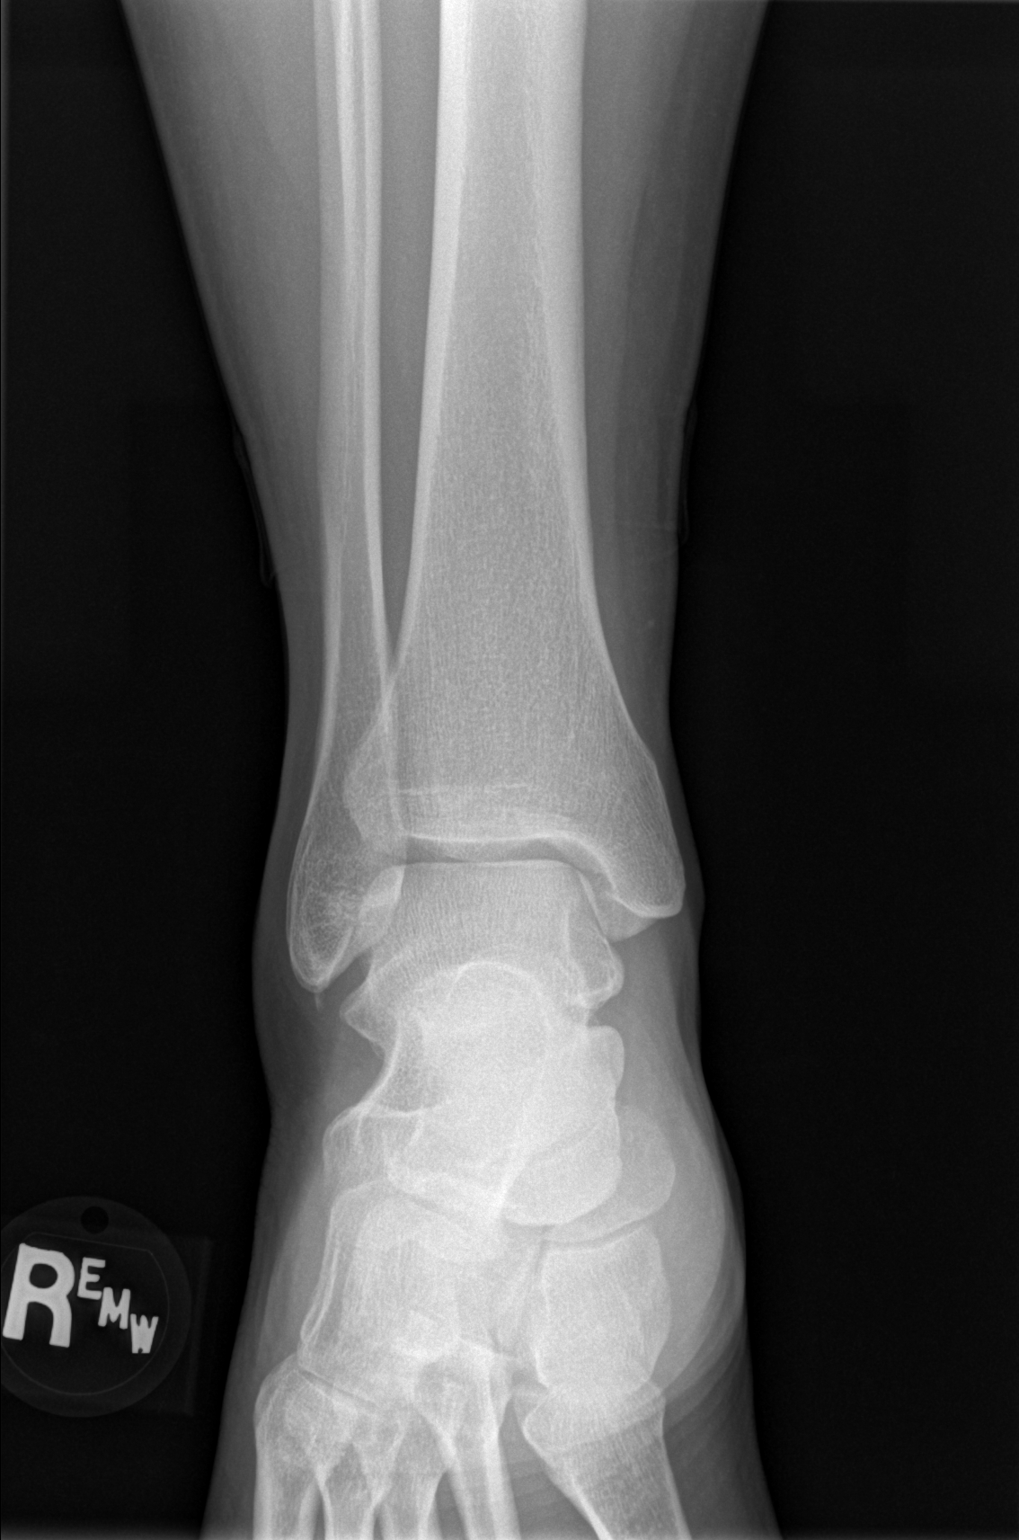

[t ankle joint oblique right]
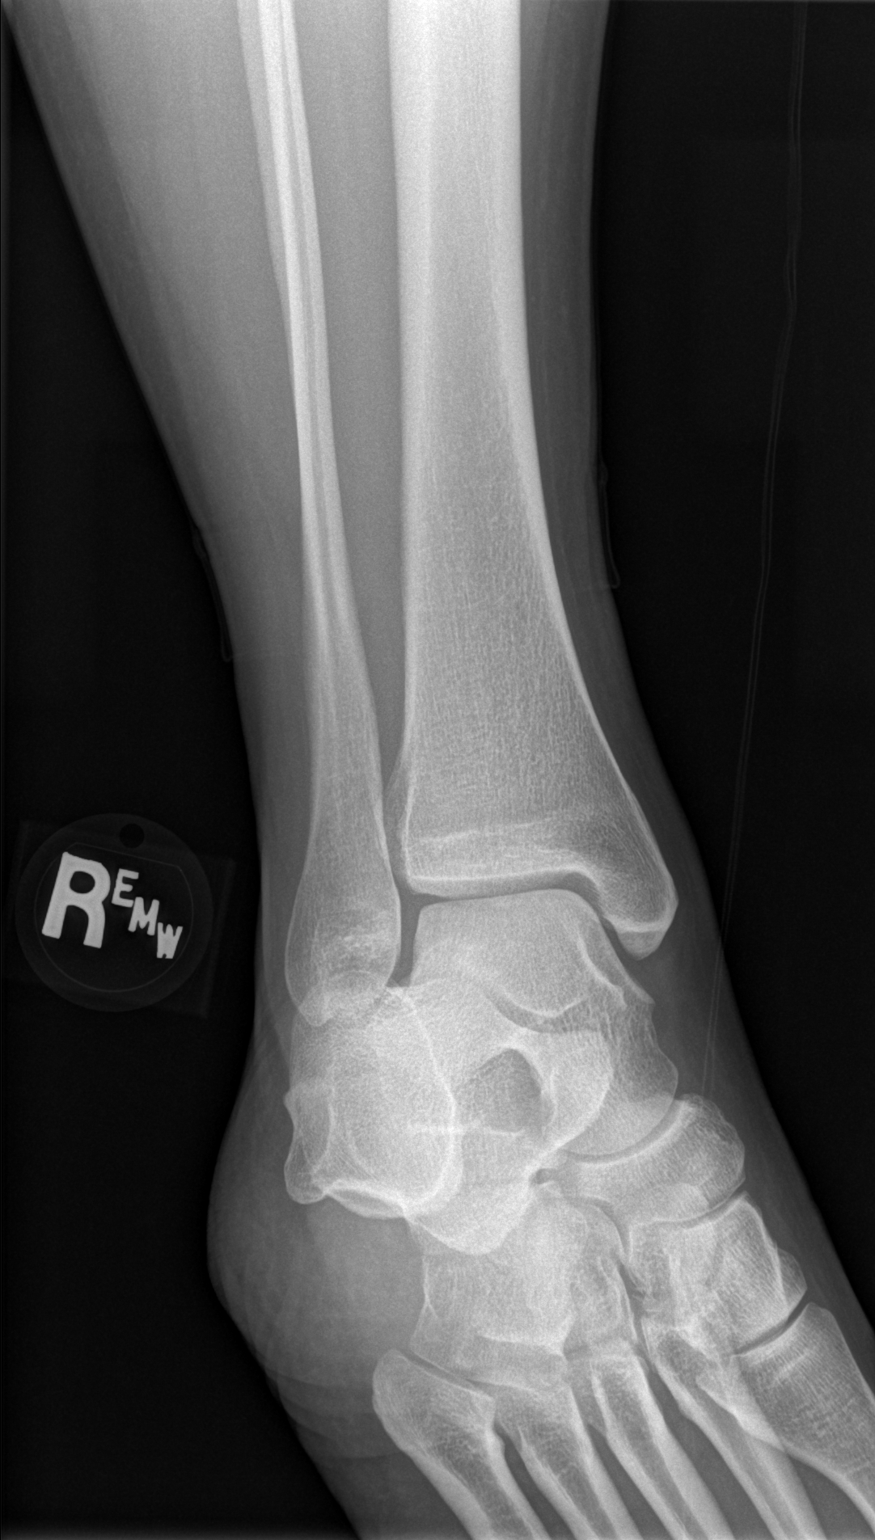

[t ankle joint lat right]
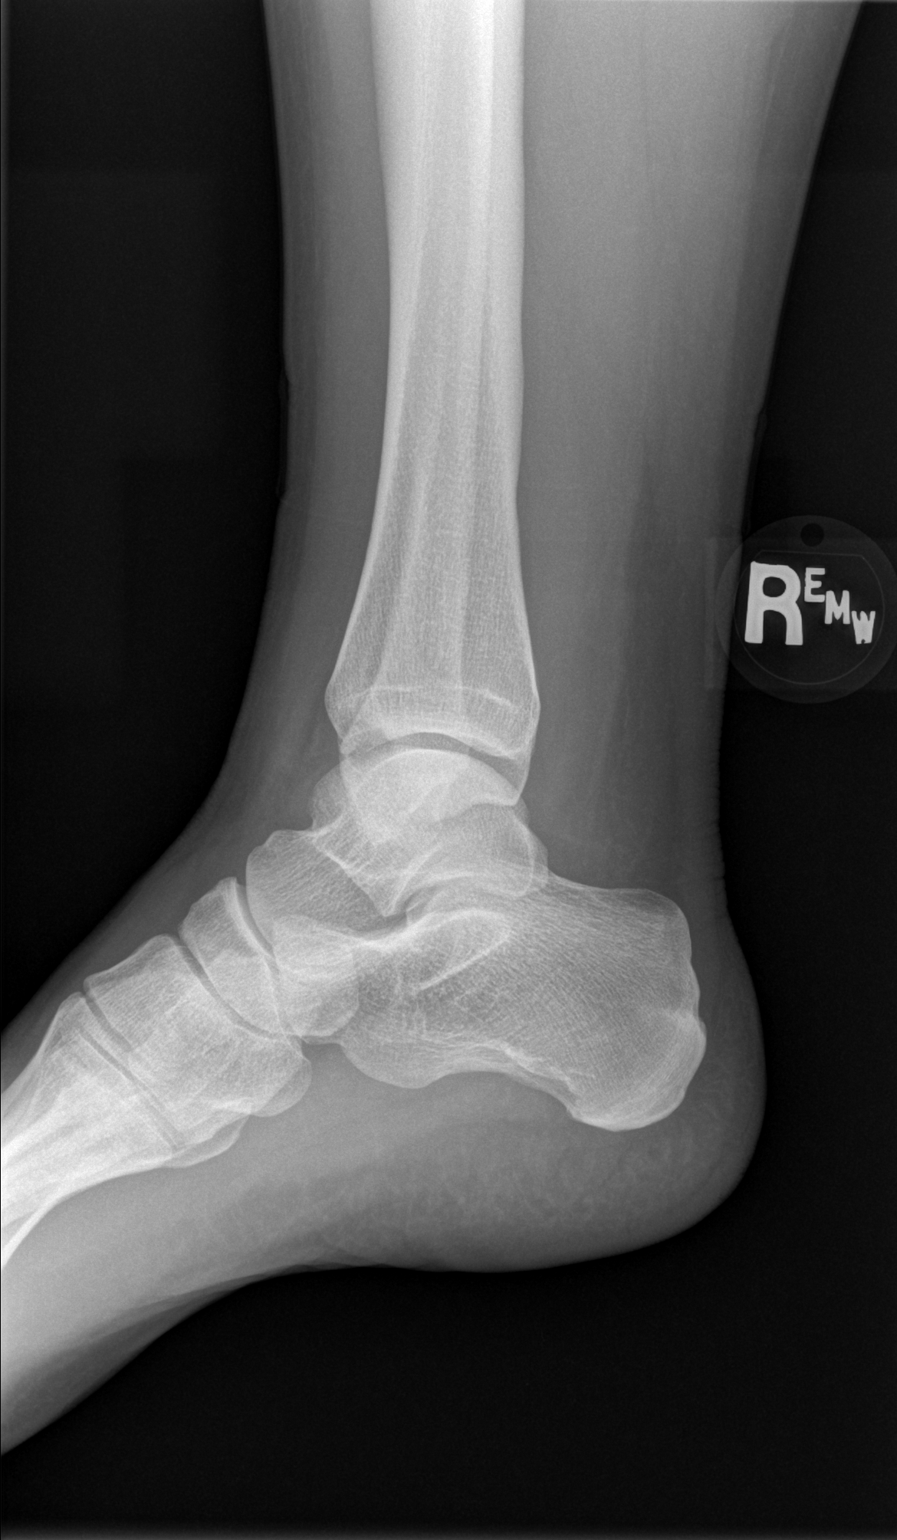

[3 of 3 positions shown; findings below may reference images not displayed]

FINDINGS: Frontal, oblique, and lateral views obtained. There is a small
avulsion arising from lateral malleolus. No other fracture evident.
No appreciable joint effusion. There is no appreciable joint space
narrowing or erosion. Ankle mortise appears intact.
IMPRESSION: Small avulsion arising from lateral malleolus. No other fracture
evident. Ankle mortise appears intact. No appreciable arthropathy.

## 2020-04-12 NOTE — L&D Delivery Note (Signed)
Delivery Note Robin Montoya is a [id number] at [redacted]w[redacted]d who had a spontaneous delivery at 1044 a viable female was delivered via LOA.  APGAR: 8, 9; weight 6lb2.1oz (3748O)  .     Admitted for induction for severe preeclampsia.  Induced with pitocin and AROM. Progressed quickly. Received epidural for pain management. Pushed for <5 minutes. Baby was delivered without difficulty. Loose nuchal cord reduced at delivery.  Delayed cord clamping for 60 seconds. Delivery of placenta was spontaneous. Placenta was found to be intact, 3 -vessel cord was noted. The fundus was found to be firm. On bimanual exam, a piece of trailing membrane was removed. Uterine cavity was manually explored and no additional placenta or membranes were palpated. Good hemostasis noted. Perineum intact. Estimated blood loss 200cc. Instrument and gauze counts were correct at the end of the procedure.   Placenta status:  to L&D for disposal  Anesthesia:  epidural Episiotomy:  none Lacerations:  none Suture Repair: none Est. Blood Loss (mL):  200  Mom to postpartum.  Baby to Couplet care / Skin to Skin.  Charlett Nose 01/22/2021, 11:12 AM

## 2020-04-25 ENCOUNTER — Encounter: Payer: Self-pay | Admitting: Emergency Medicine

## 2020-04-25 ENCOUNTER — Ambulatory Visit
Admission: EM | Admit: 2020-04-25 | Discharge: 2020-04-25 | Disposition: A | Discharge status: Home / Self Care | Payer: Commercial Managed Care - PPO | Attending: Family Medicine | Admitting: Family Medicine

## 2020-04-25 ENCOUNTER — Other Ambulatory Visit: Payer: Self-pay

## 2020-04-25 ENCOUNTER — Ambulatory Visit (INDEPENDENT_AMBULATORY_CARE_PROVIDER_SITE_OTHER): Payer: Commercial Managed Care - PPO

## 2020-04-25 DIAGNOSIS — S99911A Unspecified injury of right ankle, initial encounter: Secondary | ICD-10-CM

## 2020-04-25 DIAGNOSIS — Z8781 Personal history of (healed) traumatic fracture: Secondary | ICD-10-CM | POA: Diagnosis not present

## 2020-04-25 DIAGNOSIS — S93491A Sprain of other ligament of right ankle, initial encounter: Secondary | ICD-10-CM

## 2020-04-25 DIAGNOSIS — M25571 Pain in right ankle and joints of right foot: Secondary | ICD-10-CM

## 2020-04-25 MED ORDER — MELOXICAM 15 MG PO TABS: 15.0000 mg | ORAL_TABLET | Freq: Every day | ORAL | 0 refills | Status: DC | PRN

## 2020-04-25 NOTE — Discharge Instructions (Signed)
Rest, ice, elevation.  Medication as prescribed.  Take care  Dr. Nikolette Reindl  

## 2020-04-25 NOTE — ED Provider Notes (Signed)
[facility]    CSN: [id number] Arrival date & time: 04/25/20  1348      History   Chief Complaint Chief Complaint  Patient presents with  . Ankle Pain   HPI  35 year old female presents with the above complaint.  Patient states that she stepped out of her car and stepped into a pothole.  When she did so she twisted her right ankle.  She states that she heard a pop.  She has injured this ankle before.  No current swelling.  She reports pain particularly on the lateral aspect.  Paystub/tendon severity.  Relieving factors.  No other complaints.  Past Medical History:  Diagnosis Date  . Depression   . Heart murmur    insufficient  . Hx of varicella     Patient Active Problem List   Diagnosis Date Noted  . [redacted] weeks gestation of pregnancy 05/18/2019  . FATIGUE 09/01/2009  . ALLERGIC RHINITIS 01/06/2007  . ACNE NEC 01/06/2007    Past Surgical History:  Procedure Laterality Date  . NO PAST SURGERIES      OB History    Gravida  3   Para  2   Term  2   Preterm  0   AB  1   Living  2     SAB  1   IAB  0   Ectopic  0   Multiple  0   Live Births  2            Home Medications    Prior to Admission medications   Medication Sig Start Date End Date Taking? Authorizing Provider  meloxicam (MOBIC) 15 MG tablet Take 1 tablet (15 mg total) by mouth daily as needed for pain. 04/25/20  Yes Angelo Prindle G, DO  acetaminophen (TYLENOL) 325 MG tablet Take 650 mg by mouth every 6 (six) hours as needed for mild pain or headache.    [provider]  famotidine (PEPCID) 20 MG tablet Take 20 mg by mouth at bedtime.    [provider]  NIFEdipine (ADALAT CC) 30 MG 24 hr tablet Take 1 tablet (30 mg total) by mouth daily. 05/20/19   Carrington Clamp, MD  Prenatal Vit-Fe Fumarate-FA (PRENATAL MULTIVITAMIN) TABS tablet Take 1 tablet by mouth daily at 12 noon.    [provider]  sertraline (ZOLOFT) 50 MG tablet Take 50 mg by mouth at  bedtime.    [provider]    Family History Family History  Problem Relation Age of Onset  . Kidney Stones Mother   . Kidney Stones Father   . Depression Brother   . Bipolar disorder Brother   . Diabetes Maternal Grandfather   . Heart disease Maternal Grandfather        triple bypass  . Hypertension Maternal Grandfather   . Cancer Maternal Grandmother        colon  . Hypertension Maternal Grandmother     Social History Social History   Tobacco Use  . Smoking status: Never Smoker  . Smokeless tobacco: Never Used  Substance Use Topics  . Alcohol use: No  . Drug use: No     Allergies   Patient has no known allergies.   Review of Systems Review of Systems  Constitutional: Negative.   Musculoskeletal:       Right ankle pain   Physical Exam Triage Vital Signs ED Triage Vitals  Enc Vitals Group     BP 04/25/20 1508 133/80  Pulse Rate 04/25/20 1508 64     Resp 04/25/20 1508 17     Temp 04/25/20 1508 98.6 F (37 C)     Temp Source 04/25/20 1508 Oral     SpO2 04/25/20 1508 100 %     Weight --      Height --      Head Circumference --      Peak Flow --      Pain Score 04/25/20 1506 7     Pain Loc --      Pain Edu? --      Excl. in GC? --    Updated Vital Signs BP 133/80 (BP Location: Left Arm)   Pulse 64   Temp 98.6 F (37 C) (Oral)   Resp 17   LMP 04/22/2020 (Exact Date)   SpO2 100%   Breastfeeding No   Visual Acuity Right Eye Distance:   Left Eye Distance:   Bilateral Distance:    Right Eye Near:   Left Eye Near:    Bilateral Near:     Physical Exam Vitals and nursing note reviewed.  Constitutional:      General: She is not in acute distress.    Appearance: Normal appearance. She is not ill-appearing.  HENT:     Head: Normocephalic and atraumatic.  Eyes:     General:        Right eye: No discharge.        Left eye: No discharge.     Conjunctiva/sclera: Conjunctivae normal.  Musculoskeletal:     Comments: Right ankle  -exquisite tenderness over the lateral malleolus.  No swelling.  No bruising.  Neurological:     Mental Status: She is alert.  Psychiatric:        Mood and Affect: Mood normal.        Behavior: Behavior normal.    UC Treatments / Results  Labs (all labs ordered are listed, but only abnormal results are displayed) Labs Reviewed - No data to display  EKG   Radiology DG Ankle Complete Right  Result Date: 04/25/2020 CLINICAL DATA:  Inversion injury today, presenting with pain. History of hairline fracture right ankle 2 years ago. EXAM: RIGHT ANKLE - COMPLETE 3+ VIEW COMPARISON:  X-ray right ankle 04/03/2018. FINDINGS: Suggestion of an old fracture fragment along the distal fibula with associated trace callus formation. There is no evidence of fracture, dislocation, or joint effusion. No widening of the lateral and medial clear spaces on Mortise view. There is no evidence of arthropathy or other focal bone abnormality. Soft tissues are unremarkable. IMPRESSION: No acute displaced fracture or dislocation in a patient with an old distal fibular avulsion fracture. Electronically Signed   By: Tish Frederickson M.D.   On: 04/25/2020 16:00    Procedures Procedures (including critical care time)  Medications Ordered in UC Medications - No data to display  Initial Impression / Assessment and Plan / UC Course  I have reviewed the triage vital signs and the nursing notes.  Pertinent labs & imaging results that were available during my care of the patient were reviewed by me and considered in my medical decision making (see chart for details).    35 year old female presents with a right ankle sprain. X-ray was obtained and was independent reviewed by me. Interpretation: No acute fracture. Prior injury noted. Meloxicam as directed. Supportive care.  Final Clinical Impressions(s) / UC Diagnoses   Final diagnoses:  Sprain of anterior talofibular ligament of right ankle, initial encounter  Discharge Instructions     Rest, ice, elevation.  Medication as prescribed.  Take care  Dr. Adriana Simas    ED Prescriptions    Medication Sig Dispense Auth. Provider   meloxicam (MOBIC) 15 MG tablet Take 1 tablet (15 mg total) by mouth daily as needed for pain. 30 tablet Tommie Sams, DO     PDMP not reviewed this encounter.   Tommie Sams, [place] 04/25/20 1659

## 2020-04-25 NOTE — ED Triage Notes (Signed)
Pt states that she step into a pothole and fell over and heard her right ankle pop. Pt states that this injury happened this morning. No swelling present, pt states that she cannot move it without feeling a throbbing pain.

## 2020-06-16 LAB — OB RESULTS CONSOLE GC/CHLAMYDIA
Chlamydia: NEGATIVE
Gonorrhea: NEGATIVE

## 2020-07-10 LAB — OB RESULTS CONSOLE RUBELLA ANTIBODY, IGM: Rubella: IMMUNE

## 2020-07-10 LAB — OB RESULTS CONSOLE ABO/RH: RH Type: POSITIVE

## 2020-07-10 LAB — OB RESULTS CONSOLE RPR: RPR: NONREACTIVE

## 2020-07-10 LAB — OB RESULTS CONSOLE ANTIBODY SCREEN: Antibody Screen: NEGATIVE

## 2020-07-10 LAB — OB RESULTS CONSOLE HEPATITIS B SURFACE ANTIGEN: Hepatitis B Surface Ag: NEGATIVE

## 2020-07-10 LAB — OB RESULTS CONSOLE HIV ANTIBODY (ROUTINE TESTING): HIV: NONREACTIVE

## 2020-10-06 ENCOUNTER — Telehealth: Payer: Self-pay

## 2020-10-06 ENCOUNTER — Telehealth: Payer: Commercial Managed Care - PPO | Admitting: Physician Assistant

## 2020-10-06 ENCOUNTER — Telehealth: Payer: Commercial Managed Care - PPO | Admitting: Family

## 2020-10-06 ENCOUNTER — Ambulatory Visit
Admission: EM | Admit: 2020-10-06 | Discharge: 2020-10-06 | Disposition: A | Discharge status: Home / Self Care | Payer: Commercial Managed Care - PPO | Attending: Emergency Medicine | Admitting: Emergency Medicine

## 2020-10-06 ENCOUNTER — Other Ambulatory Visit: Payer: Self-pay

## 2020-10-06 DIAGNOSIS — U071 COVID-19: Secondary | ICD-10-CM | POA: Diagnosis not present

## 2020-10-06 DIAGNOSIS — Z3490 Encounter for supervision of normal pregnancy, unspecified, unspecified trimester: Secondary | ICD-10-CM

## 2020-10-06 MED ORDER — NIRMATRELVIR/RITONAVIR (PAXLOVID)TABLET
3.0000 | ORAL_TABLET | Freq: Two times a day (BID) | ORAL | 0 refills | Status: DC
  Filled 2020-10-06: qty 30, 5d supply, fill #0

## 2020-10-06 MED ORDER — NIRMATRELVIR/RITONAVIR (PAXLOVID)TABLET
3.0000 | ORAL_TABLET | Freq: Two times a day (BID) | ORAL | 0 refills | Status: AC
  Filled 2020-10-06: qty 30, 5d supply, fill #0

## 2020-10-06 NOTE — ED Triage Notes (Signed)
Pt states covid + and [redacted]wks pregnant. States sent here from OBGYN and evisit to get antivirals.

## 2020-10-06 NOTE — Progress Notes (Signed)
For the safety of you and your child, I recommend a face to face office visit with a health care provider.  Many mothers need to take medicines during their pregnancy and while nursing.  Almost all medicines pass into the breast milk in small quantities.  Most are generally considered safe for a mother to take but some medicines must be avoided.  After reviewing your E-Visit request, I recommend that you consult your OB/GYN or pediatrician for medical advice in relation to your condition and prescription medications while pregnant or breastfeeding.  NOTE:  There will be NO CHARGE for this eVisit  If you are having a true medical emergency please call 911.    For an urgent face to face visit, [facility] has six urgent care centers for your convenience:     [facility] [place] Get Driving Directions [phone] [address] [place], [place] [address]    [facility] ([place]) Get Driving Directions [phone] [address] [place], [place] [address]  [facility] ([place]) Get Driving Directions [phone] [address] [place],  [place]  [address]  [facility] Get Driving Directions [phone] [address] [place], [place] [address]   [facility] [place] Get Driving Directions  [phone] [address] [place], [place] [address]   [facility] [place] Get Driving Directions [phone] [address] [place], [place] [address]  Your MyChart E-visit questionnaire answers were reviewed by a board certified advanced clinical practitioner to complete your personal care plan based on your specific symptoms.  Thank you for using e-Visits.   I provided 5 minutes of non face-to-face time during this encounter for chart review and documentation.   

## 2020-10-06 NOTE — ED Provider Notes (Signed)
HPI  SUBJECTIVE:  Robin Montoya is a G4 P2 23-week pregnant 35 y.o. female who presents with body aches, headaches, cough starting this morning.  Both her husband and toddler have COVID.  She denies fevers, nasal congestion, rhinorrhea, sore throat, loss of sense of taste or smell, shortness of breath, nausea, vomiting, diarrhea, new or different abdominal pain.  She had a positive home COVID test today.  She states that she is feeling baby move.  She denies leakage of fluid, vaginal bleeding, contractions.  She called her OB/GYN and was told that she needed to be placed on an antiviral medication from [facility], had an E-visit, and was told that she needed, and for in person evaluation.  She tried Tylenol with some improvement in her symptoms.  Last dose of Tylenol was on 6 hours of evaluation.  No aggravating factors.  She has a past medical history of preeclampsia.    Past Medical History:  Diagnosis Date   Depression    Heart murmur    insufficient   Hx of varicella     Past Surgical History:  Procedure Laterality Date   NO PAST SURGERIES      Family History  Problem Relation Age of Onset   Kidney Stones Mother    Kidney Stones Father    Depression Brother    Bipolar disorder Brother    Diabetes Maternal Grandfather    Heart disease Maternal Grandfather        triple bypass   Hypertension Maternal Grandfather    Cancer Maternal Grandmother        colon   Hypertension Maternal Grandmother     Social History   Tobacco Use   Smoking status: Never   Smokeless tobacco: Never  Substance Use Topics   Alcohol use: No   Drug use: No    No current facility-administered medications for this encounter.  Current Outpatient Medications:    acetaminophen (TYLENOL) 325 MG tablet, Take 650 mg by mouth every 6 (six) hours as needed for mild pain or headache., Disp: , Rfl:    famotidine (PEPCID) 20 MG tablet, Take 20 mg by mouth at bedtime., Disp: , Rfl:    nirmatrelvir/ritonavir  EUA (PAXLOVID) TABS, Take 2 tablets of Nirmatrelvir (150 mg) twice daily for 5 days and 1 tablet of Ritonavir (100 mg) twice daily for 5 days., Disp: 30 tablet, Rfl: 0   Prenatal Vit-Fe Fumarate-FA (PRENATAL MULTIVITAMIN) TABS tablet, Take 1 tablet by mouth daily at 12 noon., Disp: , Rfl:    sertraline (ZOLOFT) 50 MG tablet, Take 50 mg by mouth at bedtime., Disp: , Rfl:   No Known Allergies   ROS  As noted in HPI.   Physical Exam  BP 128/83 (BP Location: Left Arm)   Pulse 83   Temp 98.3 F (36.8 C) (Oral)   Resp 18   LMP 04/22/2020 (Exact Date)   SpO2 96%    Constitutional: Well developed, well nourished, no acute distress Eyes:  EOMI, conjunctiva normal bilaterally HENT: Normocephalic, atraumatic,mucus membranes moist Respiratory: Normal inspiratory effort, lungs clear bilaterally Cardiovascular: Normal rate, regular rhythm, no murmurs rubs or gallops GI: nondistended soft, nontender.  Fundus height consistent with dates. skin: No rash, skin intact Musculoskeletal: no deformities Neurologic: Alert & oriented x 3, no focal neuro deficits Psychiatric: Speech and behavior appropriate   ED Course   Medications - No data to display  No orders of the defined types were placed in this encounter.   Results for orders placed  or performed during the hospital encounter of 10/06/20 (from the past 24 hour(s))  Basic metabolic panel     Status: Abnormal   Collection Time: 10/06/20  6:54 PM  Result Value Ref Range   Glucose 79 65 - 99 mg/dL   BUN 4 (L) 6 - 20 mg/dL   Creatinine, Ser 0.58 0.57 - 1.00 mg/dL   eGFR 122 >59 mL/min/1.73   BUN/Creatinine Ratio 7 (L) 9 - 23   Sodium 137 134 - 144 mmol/L   Potassium 4.2 3.5 - 5.2 mmol/L   Chloride 102 96 - 106 mmol/L   CO2 21 20 - 29 mmol/L   Calcium 9.0 8.7 - 10.2 mg/dL   Narrative   Performed at:  [facility] [address] [place], [place]  [id number] Lab Director: Rush Farmer MD, Phone:  [phone]   No  results found.  ED Clinical Impression  1. COVID-19 virus infection      ED Assessment/Plan  No kidney function available in the past 6 months.  We will send off BMP stat.  She is a candidate for Paxlovid because of pregnancy.   Do not have fetal heart tones available here.  Patient states that she is feeling baby move.  GFR 122.  Paxlovid already prescribed.  Will send in Paxlovid to the [facility], which she states her in-laws will pick it up.  Advised her to start it as soon as she can, she has 4 more days to start it in.  Anticipate that we will get her blood work back in time to get her started on time.  Follow-up with GYN as needed.  To the ER if she gets worse  Discussed labs,MDM, treatment plan, and plan for follow-up with patient.  patient agrees with plan.   Meds ordered this encounter  Medications   DISCONTD: nirmatrelvir/ritonavir EUA (PAXLOVID) TABS    Sig: Take 3 tablets by mouth 2 (two) times daily for 5 days. Patient GFR is pending.  Take nirmatrelvir (150 mg) two tablets twice daily for 5 days and ritonavir (100 mg) one tablet twice daily for 5 days.    Dispense:  30 tablet    Refill:  0    BMP is pending.  It should be back on 6/28.  She is pregnant.  She has no known kidney issues.  Please give me a call at [phone] or message me through haiku if I need to renally dose Paxlovid       *This clinic note was created using Dragon dictation software. Therefore, there may be occasional mistakes despite careful proofreading.  ?    Melynda Ripple, MD 10/07/20 1735

## 2020-10-06 NOTE — Discharge Instructions (Addendum)
Your blood work should be back tomorrow.  I have gone ahead and prescribed Paxlovid so it should be waiting for you at the pharmacy sometime tomorrow after they get your blood results.  You have 4 more days in which to start the Paxlovid.  In the meantime, saline nasal irrigation for nasal congestion, 1 g of Tylenol 3-4 times a day as needed for body aches, headaches.  Get out and walk a little bit every day to prevent blood clots in your lungs and to keep your lungs open.

## 2020-10-07 ENCOUNTER — Other Ambulatory Visit (HOSPITAL_BASED_OUTPATIENT_CLINIC_OR_DEPARTMENT_OTHER): Payer: Self-pay

## 2020-10-07 LAB — BASIC METABOLIC PANEL
BUN/Creatinine Ratio: 7 — ABNORMAL LOW (ref 9–23)
BUN: 4 mg/dL — ABNORMAL LOW (ref 6–20)
CO2: 21 mmol/L (ref 20–29)
Calcium: 9 mg/dL (ref 8.7–10.2)
Chloride: 102 mmol/L (ref 96–106)
Creatinine, Ser: 0.58 mg/dL (ref 0.57–1.00)
Glucose: 79 mg/dL (ref 65–99)
Potassium: 4.2 mmol/L (ref 3.5–5.2)
Sodium: 137 mmol/L (ref 134–144)
eGFR: 122 mL/min/{1.73_m2} (ref >59)

## 2021-01-08 LAB — OB RESULTS CONSOLE GBS: GBS: POSITIVE

## 2021-01-15 ENCOUNTER — Other Ambulatory Visit: Payer: Self-pay | Admitting: Obstetrics and Gynecology

## 2021-01-20 ENCOUNTER — Encounter (HOSPITAL_COMMUNITY): Payer: Self-pay | Admitting: *Deleted

## 2021-01-21 ENCOUNTER — Inpatient Hospital Stay (HOSPITAL_COMMUNITY)
Admission: AD | Admit: 2021-01-21 | Discharge: 2021-01-24 | DRG: 806 | Disposition: A | Discharge status: Home / Self Care | Payer: Commercial Managed Care - PPO | Attending: Obstetrics and Gynecology | Admitting: Obstetrics and Gynecology

## 2021-01-21 ENCOUNTER — Encounter (HOSPITAL_COMMUNITY): Payer: Self-pay | Admitting: Obstetrics and Gynecology

## 2021-01-21 ENCOUNTER — Other Ambulatory Visit: Payer: Self-pay

## 2021-01-21 DIAGNOSIS — Z20822 Contact with and (suspected) exposure to covid-19: Secondary | ICD-10-CM | POA: Diagnosis present

## 2021-01-21 DIAGNOSIS — O9081 Anemia of the puerperium: Secondary | ICD-10-CM | POA: Diagnosis not present

## 2021-01-21 DIAGNOSIS — O133 Gestational [pregnancy-induced] hypertension without significant proteinuria, third trimester: Secondary | ICD-10-CM | POA: Diagnosis not present

## 2021-01-21 DIAGNOSIS — D62 Acute posthemorrhagic anemia: Secondary | ICD-10-CM | POA: Diagnosis not present

## 2021-01-21 DIAGNOSIS — Z3A38 38 weeks gestation of pregnancy: Secondary | ICD-10-CM | POA: Diagnosis not present

## 2021-01-21 DIAGNOSIS — Z3689 Encounter for other specified antenatal screening: Secondary | ICD-10-CM

## 2021-01-21 DIAGNOSIS — O1413 Severe pre-eclampsia, third trimester: Secondary | ICD-10-CM

## 2021-01-21 DIAGNOSIS — O480 Post-term pregnancy: Secondary | ICD-10-CM | POA: Diagnosis present

## 2021-01-21 DIAGNOSIS — O1414 Severe pre-eclampsia complicating childbirth: Secondary | ICD-10-CM | POA: Diagnosis present

## 2021-01-21 DIAGNOSIS — O99824 Streptococcus B carrier state complicating childbirth: Secondary | ICD-10-CM | POA: Diagnosis present

## 2021-01-21 DIAGNOSIS — O26893 Other specified pregnancy related conditions, third trimester: Secondary | ICD-10-CM | POA: Diagnosis present

## 2021-01-21 DIAGNOSIS — Z3A39 39 weeks gestation of pregnancy: Secondary | ICD-10-CM

## 2021-01-21 LAB — CBC
HCT: 34.3 % — ABNORMAL LOW (ref 36.0–46.0)
Hemoglobin: 11 g/dL — ABNORMAL LOW (ref 12.0–15.0)
MCH: 26.6 pg (ref 26.0–34.0)
MCHC: 32.1 g/dL (ref 30.0–36.0)
MCV: 82.9 fL (ref 80.0–100.0)
Platelets: 337 10*3/uL (ref 150–400)
RBC: 4.14 MIL/uL (ref 3.87–5.11)
RDW: 13.9 % (ref 11.5–15.5)
WBC: 13.7 10*3/uL — ABNORMAL HIGH (ref 4.0–10.5)
nRBC: 0 % (ref 0.0–0.2)

## 2021-01-21 MED ORDER — OXYTOCIN BOLUS FROM INFUSION
333.0000 mL | Freq: Once | INTRAVENOUS | Status: AC
  Administered 2021-01-22: 333 mL via INTRAVENOUS

## 2021-01-21 MED ORDER — ACETAMINOPHEN 325 MG PO TABS: 650.0000 mg | ORAL_TABLET | ORAL | Status: DC | PRN

## 2021-01-21 MED ORDER — SODIUM CHLORIDE 0.9 % IV SOLN
5.0000 10*6.[IU] | Freq: Once | INTRAVENOUS | Status: AC
  Administered 2021-01-22: 5 10*6.[IU] via INTRAVENOUS
  Filled 2021-01-21: qty 5

## 2021-01-21 MED ORDER — OXYCODONE-ACETAMINOPHEN 5-325 MG PO TABS: 2.0000 | ORAL_TABLET | ORAL | Status: DC | PRN

## 2021-01-21 MED ORDER — TERBUTALINE SULFATE 1 MG/ML IJ SOLN: 0.2500 mg | Freq: Once | INTRAMUSCULAR | Status: DC | PRN

## 2021-01-21 MED ORDER — LIDOCAINE HCL (PF) 1 % IJ SOLN: 30.0000 mL | INTRAMUSCULAR | Status: DC | PRN

## 2021-01-21 MED ORDER — LACTATED RINGERS IV SOLN: 500.0000 mL | INTRAVENOUS | Status: DC | PRN

## 2021-01-21 MED ORDER — PENICILLIN G POT IN DEXTROSE 60000 UNIT/ML IV SOLN
3.0000 10*6.[IU] | INTRAVENOUS | Status: DC
  Administered 2021-01-22 (×2): 3 10*6.[IU] via INTRAVENOUS
  Filled 2021-01-21 (×2): qty 50

## 2021-01-21 MED ORDER — SOD CITRATE-CITRIC ACID 500-334 MG/5ML PO SOLN: 30.0000 mL | ORAL | Status: DC | PRN

## 2021-01-21 MED ORDER — OXYTOCIN-SODIUM CHLORIDE 30-0.9 UT/500ML-% IV SOLN
2.5000 [IU]/h | INTRAVENOUS | Status: DC
  Administered 2021-01-22: 2.5 [IU]/h via INTRAVENOUS

## 2021-01-21 MED ORDER — FLEET ENEMA 7-19 GM/118ML RE ENEM: 1.0000 | ENEMA | RECTAL | Status: DC | PRN

## 2021-01-21 MED ORDER — MISOPROSTOL 25 MCG QUARTER TABLET: 25.0000 ug | ORAL_TABLET | ORAL | Status: DC | PRN

## 2021-01-21 MED ORDER — LABETALOL HCL 5 MG/ML IV SOLN
20.0000 mg | INTRAVENOUS | Status: DC | PRN
  Administered 2021-01-21 – 2021-01-22 (×2): 20 mg via INTRAVENOUS
  Filled 2021-01-21 (×2): qty 4

## 2021-01-21 MED ORDER — HYDRALAZINE HCL 20 MG/ML IJ SOLN: 10.0000 mg | INTRAMUSCULAR | Status: DC | PRN

## 2021-01-21 MED ORDER — ONDANSETRON HCL 4 MG/2ML IJ SOLN: 4.0000 mg | Freq: Four times a day (QID) | INTRAMUSCULAR | Status: DC | PRN

## 2021-01-21 MED ORDER — OXYCODONE-ACETAMINOPHEN 5-325 MG PO TABS: 1.0000 | ORAL_TABLET | ORAL | Status: DC | PRN

## 2021-01-21 MED ORDER — LABETALOL HCL 5 MG/ML IV SOLN: 40.0000 mg | INTRAVENOUS | Status: DC | PRN

## 2021-01-21 MED ORDER — LACTATED RINGERS IV SOLN: INTRAVENOUS | Status: DC

## 2021-01-21 MED ORDER — LABETALOL HCL 5 MG/ML IV SOLN: 80.0000 mg | INTRAVENOUS | Status: DC | PRN

## 2021-01-21 NOTE — MAU Note (Signed)
Pt reports ctx's every 3 minutes since 2000.   Denies vaginal bleeding Denies LOF  Reports +FM

## 2021-01-21 NOTE — MAU Provider Note (Signed)
Chief Complaint:  Contractions   Event Date/Time   First Provider Initiated Contact with Patient 01/21/21 2307     HPI: Robin Montoya is a 35 y.o. [phone] at 86w2dwho presents to maternity admissions reporting painful contractions   BP noted to be significantly elevated on evaluation.  . She reports good fetal movement, denies LOF, vaginal bleeding, vaginal itching/burning, urinary symptoms, h/a, dizziness, n/v, diarrhea, constipation or fever/chills.  She denies headache, visual changes or RUQ abdominal pain.  Hypertension This is a new problem. The current episode started today. Pertinent negatives include no blurred vision, chest pain, headaches, peripheral edema or shortness of breath. There are no associated agents to hypertension. Past treatments include nothing. There are no compliance problems.   Abdominal Pain This is a new problem. The current episode started today. The problem occurs intermittently. The quality of the pain is cramping. Pertinent negatives include no diarrhea, fever, headaches, myalgias, nausea or vomiting. Nothing aggravates the pain. The pain is relieved by Nothing. She has tried nothing for the symptoms.   RN Note: Pt reports ctx's every 3 minutes since 2000.   Denies vaginal bleeding     Denies LOF    Reports +FM  Past Medical History: Past Medical History:  Diagnosis Date   Depression    Heart murmur    insufficient   Hx of varicella     Past obstetric history: OB History  Gravida Para Term Preterm AB Living  4 2 2  0 1 2  SAB IAB Ectopic Multiple Live Births  1 0 0 0 2    # Outcome Date GA Lbr Len/2nd Weight Sex Delivery Anes PTL Lv  4 Current           3 Term 05/18/19 [redacted]w[redacted]d 02:21 / 00:09 3105 g F Vag-Spont EPI  LIV     Birth Comments: WNL  2 Term 02/07/13 [redacted]w[redacted]d 08:48 / 00:54 3266 g F Vag-Spont EPI  LIV  1 SAB             Past Surgical History: Past Surgical History:  Procedure Laterality Date   NO PAST SURGERIES      Family  History: Family History  Problem Relation Age of Onset   Kidney Stones Mother    Kidney Stones Father    Depression Brother    Bipolar disorder Brother    Diabetes Maternal Grandfather    Heart disease Maternal Grandfather        triple bypass   Hypertension Maternal Grandfather    Cancer Maternal Grandmother        colon   Hypertension Maternal Grandmother     Social History: Social History   Tobacco Use   Smoking status: Never   Smokeless tobacco: Never  Vaping Use   Vaping Use: Never used  Substance Use Topics   Alcohol use: No   Drug use: No    Allergies: No Known Allergies  Meds:  Medications Prior to Admission  Medication Sig Dispense Refill Last Dose   aspirin 81 MG chewable tablet Chew by mouth daily.   01/21/2021   famotidine (PEPCID) 20 MG tablet Take 20 mg by mouth at bedtime.   01/21/2021   sertraline (ZOLOFT) 50 MG tablet Take 50 mg by mouth at bedtime.   01/21/2021   acetaminophen (TYLENOL) 325 MG tablet Take 650 mg by mouth every 6 (six) hours as needed for mild pain or headache.      Prenatal Vit-Fe Fumarate-FA (PRENATAL MULTIVITAMIN) TABS tablet Take 1 tablet by  mouth daily at 12 noon.       I have reviewed patient's Past Medical Hx, Surgical Hx, Family Hx, Social Hx, medications and allergies.   ROS:  Review of Systems  Constitutional:  Negative for fever.  Eyes:  Negative for blurred vision.  Respiratory:  Negative for shortness of breath.   Cardiovascular:  Negative for chest pain.  Gastrointestinal:  Positive for abdominal pain. Negative for diarrhea, nausea and vomiting.  Musculoskeletal:  Negative for myalgias.  Neurological:  Negative for headaches.  Other systems negative  Physical Exam  Patient Vitals for the past 24 hrs:  BP Temp Pulse Resp SpO2  01/21/21 2259 (!) 152/92 -- 79 -- --  01/21/21 2243 (!) 160/96 -- 78 -- 100 %  01/21/21 2240 -- 98.3 F (36.8 C) -- 16 --   Constitutional: Well-developed, well-nourished female in no  acute distress.  Cardiovascular: normal rate and rhythm Respiratory: normal effort, clear to auscultation bilaterally GI: Abd soft, non-tender, gravid appropriate for gestational age.   No rebound or guarding. MS: Extremities nontender, no edema, normal ROM Neurologic: Alert and oriented x 4.  GU: Neg CVAT.  PELVIC EXAM:  Dilation: 2 Effacement (%): 50 Station: -2 Presentation: Vertex Exam by:: Zenia Resides, RN  FHT:  Baseline 135 , moderate variability, accelerations present, no decelerations Contractions: q 3 mins    Labs: Results for orders placed or performed during the hospital encounter of 01/21/21 (from the past 24 hour(s))  CBC     Status: Abnormal   Collection Time: 01/21/21 11:28 PM  Result Value Ref Range   WBC 13.7 (H) 4.0 - 10.5 K/uL   RBC 4.14 3.87 - 5.11 MIL/uL   Hemoglobin 11.0 (L) 12.0 - 15.0 g/dL   HCT 38.2 (L) 50.5 - 39.7 %   MCV 82.9 80.0 - 100.0 fL   MCH 26.6 26.0 - 34.0 pg   MCHC 32.1 30.0 - 36.0 g/dL   RDW 67.3 41.9 - 37.9 %   Platelets 337 150 - 400 K/uL   nRBC 0.0 0.0 - 0.2 %  Comprehensive metabolic panel     Status: Abnormal   Collection Time: 01/21/21 11:28 PM  Result Value Ref Range   Sodium 135 135 - 145 mmol/L   Potassium 3.6 3.5 - 5.1 mmol/L   Chloride 104 98 - 111 mmol/L   CO2 19 (L) 22 - 32 mmol/L   Glucose, Bld 97 70 - 99 mg/dL   BUN <5 (L) 6 - 20 mg/dL   Creatinine, Ser 0.24 0.44 - 1.00 mg/dL   Calcium 8.9 8.9 - 09.7 mg/dL   Total Protein 6.4 (L) 6.5 - 8.1 g/dL   Albumin 2.8 (L) 3.5 - 5.0 g/dL   AST 16 15 - 41 U/L   ALT 11 0 - 44 U/L   Alkaline Phosphatase 142 (H) 38 - 126 U/L   Total Bilirubin 0.5 0.3 - 1.2 mg/dL   GFR, Estimated >35 >32 mL/min   Anion gap 12 5 - 15  Type and screen     Status: None   Collection Time: 01/21/21 11:28 PM  Result Value Ref Range   ABO/RH(D) O POS    Antibody Screen NEG    Sample Expiration      01/24/2021,2359 Performed at [facility], [address] [place], [place] [address]    Protein / creatinine ratio, urine     Status: None   Collection Time: 01/21/21 11:55 PM  Result Value Ref Range   Creatinine, Urine 110.09  mg/dL   Total Protein, Urine 13 mg/dL   Protein Creatinine Ratio 0.12 0.00 - 0.15 mg/mg[Cre]    O/Positive/-- (03/31 0000)  Imaging:  No results found.  MAU Course/MDM: I have ordered labs and reviewed results. These are normal  NST reviewed, reactive, Category I Consult Dr Mindi Slicker with presentation, exam findings and test results.  Treatments in MAU included EFM.    Assessment: Single IUP at [redacted]w[redacted]d Gestational Hypertension with some severe range BPs. No signs of preeclampsia  Reactive nonstress test, Negative CST  Plan: Admit to  Labor and Delivery Routine orders (released sign and held orders) IOL per Dr Mindi Slicker MD to follow  Wynelle Bourgeois CNM, MSN Certified Nurse-Midwife 01/21/2021 11:07 PM

## 2021-01-22 ENCOUNTER — Encounter (HOSPITAL_COMMUNITY): Payer: Self-pay | Admitting: Obstetrics and Gynecology

## 2021-01-22 ENCOUNTER — Inpatient Hospital Stay (HOSPITAL_COMMUNITY): Payer: Commercial Managed Care - PPO | Admitting: Anesthesiology

## 2021-01-22 LAB — COMPREHENSIVE METABOLIC PANEL
ALT: 11 U/L (ref 0–44)
AST: 16 U/L (ref 15–41)
Albumin: 2.8 g/dL — ABNORMAL LOW (ref 3.5–5.0)
Alkaline Phosphatase: 142 U/L — ABNORMAL HIGH (ref 38–126)
Anion gap: 12 (ref 5–15)
BUN: <5 mg/dL — ABNORMAL LOW (ref 6–20)
CO2: 19 mmol/L — ABNORMAL LOW (ref 22–32)
Calcium: 8.9 mg/dL (ref 8.9–10.3)
Chloride: 104 mmol/L (ref 98–111)
Creatinine, Ser: 0.69 mg/dL (ref 0.44–1.00)
GFR, Estimated: >60 mL/min (ref >60)
Glucose, Bld: 97 mg/dL (ref 70–99)
Potassium: 3.6 mmol/L (ref 3.5–5.1)
Sodium: 135 mmol/L (ref 135–145)
Total Bilirubin: 0.5 mg/dL (ref 0.3–1.2)
Total Protein: 6.4 g/dL — ABNORMAL LOW (ref 6.5–8.1)

## 2021-01-22 LAB — RESP PANEL BY RT-PCR (FLU A&B, COVID) ARPGX2
Influenza A by PCR: NEGATIVE
Influenza B by PCR: NEGATIVE
SARS Coronavirus 2 by RT PCR: NEGATIVE

## 2021-01-22 LAB — CBC
HCT: 31.1 % — ABNORMAL LOW (ref 36.0–46.0)
Hemoglobin: 9.8 g/dL — ABNORMAL LOW (ref 12.0–15.0)
MCH: 26.1 pg (ref 26.0–34.0)
MCHC: 31.5 g/dL (ref 30.0–36.0)
MCV: 82.9 fL (ref 80.0–100.0)
Platelets: 298 10*3/uL (ref 150–400)
RBC: 3.75 MIL/uL — ABNORMAL LOW (ref 3.87–5.11)
RDW: 14.3 % (ref 11.5–15.5)
WBC: 10.1 10*3/uL (ref 4.0–10.5)
nRBC: 0 % (ref 0.0–0.2)

## 2021-01-22 LAB — TYPE AND SCREEN
ABO/RH(D): O POS
Antibody Screen: NEGATIVE

## 2021-01-22 LAB — RPR: RPR Ser Ql: NONREACTIVE

## 2021-01-22 LAB — PROTEIN / CREATININE RATIO, URINE
Creatinine, Urine: 110.09 mg/dL
Protein Creatinine Ratio: 0.12 mg/mg{Cre} (ref 0.00–0.15)
Total Protein, Urine: 13 mg/dL

## 2021-01-22 MED ORDER — TERBUTALINE SULFATE 1 MG/ML IJ SOLN: 0.2500 mg | Freq: Once | INTRAMUSCULAR | Status: DC | PRN

## 2021-01-22 MED ORDER — ONDANSETRON HCL 4 MG/2ML IJ SOLN: 4.0000 mg | INTRAMUSCULAR | Status: DC | PRN

## 2021-01-22 MED ORDER — MAGNESIUM SULFATE BOLUS VIA INFUSION
4.0000 g | Freq: Once | INTRAVENOUS | Status: AC
  Administered 2021-01-22: 4 g via INTRAVENOUS
  Filled 2021-01-22: qty 1000

## 2021-01-22 MED ORDER — SIMETHICONE 80 MG PO CHEW: 80.0000 mg | CHEWABLE_TABLET | ORAL | Status: DC | PRN

## 2021-01-22 MED ORDER — PRENATAL MULTIVITAMIN CH
1.0000 | ORAL_TABLET | Freq: Every day | ORAL | Status: DC
  Administered 2021-01-22: 1 via ORAL
  Filled 2021-01-22 (×2): qty 1

## 2021-01-22 MED ORDER — OXYTOCIN-SODIUM CHLORIDE 30-0.9 UT/500ML-% IV SOLN: 1.0000 m[IU]/min | INTRAVENOUS | Status: DC

## 2021-01-22 MED ORDER — PHENYLEPHRINE 40 MCG/ML (10ML) SYRINGE FOR IV PUSH (FOR BLOOD PRESSURE SUPPORT): 80.0000 ug | PREFILLED_SYRINGE | INTRAVENOUS | Status: DC | PRN

## 2021-01-22 MED ORDER — ONDANSETRON HCL 4 MG PO TABS: 4.0000 mg | ORAL_TABLET | ORAL | Status: DC | PRN

## 2021-01-22 MED ORDER — DIPHENHYDRAMINE HCL 50 MG/ML IJ SOLN: 12.5000 mg | INTRAMUSCULAR | Status: DC | PRN

## 2021-01-22 MED ORDER — DIBUCAINE (PERIANAL) 1 % EX OINT: 1.0000 "application " | TOPICAL_OINTMENT | CUTANEOUS | Status: DC | PRN

## 2021-01-22 MED ORDER — MAGNESIUM SULFATE 40 GM/1000ML IV SOLN
2.0000 g/h | INTRAVENOUS | Status: AC
  Administered 2021-01-22 – 2021-01-23 (×2): 2 g/h via INTRAVENOUS
  Filled 2021-01-22 (×2): qty 1000

## 2021-01-22 MED ORDER — OXYTOCIN-SODIUM CHLORIDE 30-0.9 UT/500ML-% IV SOLN
1.0000 m[IU]/min | INTRAVENOUS | Status: DC
  Administered 2021-01-22: 2 m[IU]/min via INTRAVENOUS
  Filled 2021-01-22: qty 500

## 2021-01-22 MED ORDER — COCONUT OIL OIL: 1.0000 "application " | TOPICAL_OIL | Status: DC | PRN

## 2021-01-22 MED ORDER — OXYCODONE HCL 5 MG PO TABS: 10.0000 mg | ORAL_TABLET | ORAL | Status: DC | PRN

## 2021-01-22 MED ORDER — LACTATED RINGERS IV SOLN: 500.0000 mL | Freq: Once | INTRAVENOUS | Status: DC

## 2021-01-22 MED ORDER — TETANUS-DIPHTH-ACELL PERTUSSIS 5-2.5-18.5 LF-MCG/0.5 IM SUSY: 0.5000 mL | PREFILLED_SYRINGE | Freq: Once | INTRAMUSCULAR | Status: DC

## 2021-01-22 MED ORDER — WITCH HAZEL-GLYCERIN EX PADS: 1.0000 "application " | MEDICATED_PAD | CUTANEOUS | Status: DC | PRN

## 2021-01-22 MED ORDER — DOCUSATE SODIUM 100 MG PO CAPS
100.0000 mg | ORAL_CAPSULE | Freq: Two times a day (BID) | ORAL | Status: DC
  Administered 2021-01-23: 100 mg via ORAL
  Filled 2021-01-22 (×3): qty 1

## 2021-01-22 MED ORDER — LIDOCAINE HCL (PF) 1 % IJ SOLN
INTRAMUSCULAR | Status: DC | PRN
  Administered 2021-01-22: 10 mL via EPIDURAL

## 2021-01-22 MED ORDER — DIPHENHYDRAMINE HCL 25 MG PO CAPS: 25.0000 mg | ORAL_CAPSULE | Freq: Four times a day (QID) | ORAL | Status: DC | PRN

## 2021-01-22 MED ORDER — OXYCODONE HCL 5 MG PO TABS: 5.0000 mg | ORAL_TABLET | ORAL | Status: DC | PRN

## 2021-01-22 MED ORDER — EPHEDRINE 5 MG/ML INJ: 10.0000 mg | INTRAVENOUS | Status: DC | PRN

## 2021-01-22 MED ORDER — IBUPROFEN 600 MG PO TABS
600.0000 mg | ORAL_TABLET | Freq: Four times a day (QID) | ORAL | Status: DC
  Administered 2021-01-22 – 2021-01-24 (×7): 600 mg via ORAL
  Filled 2021-01-22 (×7): qty 1

## 2021-01-22 MED ORDER — NIFEDIPINE ER OSMOTIC RELEASE 30 MG PO TB24
30.0000 mg | ORAL_TABLET | Freq: Every day | ORAL | Status: DC
  Administered 2021-01-22 – 2021-01-23 (×2): 30 mg via ORAL
  Filled 2021-01-22 (×2): qty 1

## 2021-01-22 MED ORDER — LACTATED RINGERS IV SOLN: INTRAVENOUS | Status: DC

## 2021-01-22 MED ORDER — ACETAMINOPHEN 325 MG PO TABS
650.0000 mg | ORAL_TABLET | ORAL | Status: DC | PRN
  Administered 2021-01-23: 650 mg via ORAL
  Filled 2021-01-22: qty 2

## 2021-01-22 MED ORDER — BENZOCAINE-MENTHOL 20-0.5 % EX AERO: 1.0000 "application " | INHALATION_SPRAY | CUTANEOUS | Status: DC | PRN

## 2021-01-22 MED ORDER — FLEET ENEMA 7-19 GM/118ML RE ENEM: 1.0000 | ENEMA | Freq: Every day | RECTAL | Status: DC | PRN

## 2021-01-22 MED ORDER — SERTRALINE HCL 50 MG PO TABS
50.0000 mg | ORAL_TABLET | Freq: Every day | ORAL | Status: DC
  Administered 2021-01-22 – 2021-01-23 (×2): 50 mg via ORAL
  Filled 2021-01-22 (×2): qty 1

## 2021-01-22 MED ORDER — BISACODYL 10 MG RE SUPP: 10.0000 mg | Freq: Every day | RECTAL | Status: DC | PRN

## 2021-01-22 MED ORDER — FENTANYL-BUPIVACAINE-NACL 0.5-0.125-0.9 MG/250ML-% EP SOLN
12.0000 mL/h | EPIDURAL | Status: DC | PRN
  Administered 2021-01-22: 12 mL/h via EPIDURAL
  Filled 2021-01-22: qty 250

## 2021-01-22 NOTE — Anesthesia Postprocedure Evaluation (Signed)
Anesthesia Post Note  Patient: Robin Montoya  Procedure(s) Performed: AN AD HOC LABOR EPIDURAL     Patient location during evaluation: Mother Baby Anesthesia Type: Epidural Level of consciousness: awake and alert Pain management: pain level controlled Vital Signs Assessment: post-procedure vital signs reviewed and stable Respiratory status: spontaneous breathing, nonlabored ventilation and respiratory function stable Cardiovascular status: stable Postop Assessment: no headache, no backache and epidural receding Anesthetic complications: no   No notable events documented.  Last Vitals:  Vitals:   01/22/21 1406 01/22/21 1411  BP: (!) 160/83 (!) 156/76  Pulse: 66 68  Resp: 16   Temp:    SpO2:      Last Pain:  Vitals:   01/22/21 1257  TempSrc: Oral  PainSc:    Pain Goal:                   [name]

## 2021-01-22 NOTE — Anesthesia Procedure Notes (Signed)
Epidural Patient location during procedure: OB Start time: 01/22/2021 9:00 AM End time: 01/22/2021 9:13 AM  Staffing Anesthesiologist: Lucretia Kern, MD Performed: anesthesiologist   Preanesthetic Checklist Completed: patient identified, IV checked, risks and benefits discussed, monitors and equipment checked, pre-op evaluation and timeout performed  Epidural Patient position: sitting Prep: DuraPrep Patient monitoring: heart rate, continuous pulse ox and blood pressure Approach: midline Location: L3-L4 Injection technique: LOR air  Needle:  Needle type: Tuohy  Needle gauge: 17 G Needle length: 9 cm Needle insertion depth: 5 cm Catheter type: closed end flexible Catheter size: 19 Gauge Catheter at skin depth: 10 cm Test dose: negative  Assessment Events: blood not aspirated, injection not painful, no injection resistance, no paresthesia and negative IV test  Additional Notes Reason for block:procedure for pain

## 2021-01-22 NOTE — H&P (Addendum)
Robin Montoya is a 4 y.U.[id number] female who presented to [facility] at 73 3/[redacted]wks gestation with complaint of painful contractions. Pt noted to be 2cm dilated - mild change from 1cm dil in office a week ago. However, pt was noted to have elevated BP of .138-156/75-93 - new for this pregnancy. No associated HA or visual changes. Pt also with hisotry of ghtn in previous pregnancy; had been on baby asa. PreE workup wnl   GBS pos; NKDA. Panorama Low risk OB History     Gravida  4   Para  2   Term  2   Preterm  0   AB  1   Living  2      SAB  1   IAB  0   Ectopic  0   Multiple  0   Live Births  2          Past Medical History:  Diagnosis Date   Depression    Heart murmur    insufficient   Hx of varicella    Past Surgical History:  Procedure Laterality Date   NO PAST SURGERIES     Family History: family history includes Bipolar disorder in her brother; Cancer in her maternal grandmother; Depression in her brother; Diabetes in her maternal grandfather; Heart disease in her maternal grandfather; Hypertension in her maternal grandfather and maternal grandmother; Kidney Stones in her father and mother. Social History:  reports that she has never smoked. She has never used smokeless tobacco. She reports that she does not drink alcohol and does not use drugs.     Maternal Diabetes: No Genetic Screening: Normal Maternal Ultrasounds/Referrals: Normal Fetal Ultrasounds or other Referrals:  None Maternal Substance Abuse:  No Significant Maternal Medications:  None Significant Maternal Lab Results:  Group B Strep positive Other Comments:  None  Review of Systems  Constitutional:  Positive for activity change. Negative for fatigue.  Eyes:  Negative for photophobia and visual disturbance.  Respiratory:  Negative for chest tightness and shortness of breath.   Cardiovascular:  Negative for chest pain, palpitations and leg swelling.  Gastrointestinal:  Positive for abdominal  pain.  Genitourinary:  Positive for pelvic pain.  Neurological:  Negative for light-headedness and headaches.  Psychiatric/Behavioral:  The patient is not nervous/anxious.   Maternal Medical History:  Reason for admission: ghtn  Contractions: Onset was 3-5 hours ago.   Frequency: irregular.   Perceived severity is moderate.   Fetal activity: Perceived fetal activity is normal.   Prenatal complications: no prenatal complications Prenatal Complications - Diabetes: none.  Dilation: 3 Effacement (%): 50 Station: -2 Exam by:: Dr. Mindi Slicker Blood pressure (!) 141/84, pulse 67, temperature 98.3 F (36.8 C), resp. rate 16, last menstrual period 04/22/2020, SpO2 100 %, not currently breastfeeding. Maternal Exam:  Uterine Assessment: Contraction strength is mild.  Contraction frequency is irregular.  Abdomen: Patient reports generalized tenderness.  Estimated fetal weight is AGA 26%ile.   Fetal presentation: vertex Introitus: Normal vulva. Vulva is negative for condylomata and lesion.  Normal vagina.  Vagina is negative for condylomata.  Pelvis: adequate for delivery.   Cervix: Cervix evaluated by digital exam.     Fetal Exam Fetal Monitor Review: Baseline rate: 145.  Variability: moderate (6-25 bpm).   Pattern: accelerations present and no decelerations.   Fetal State Assessment: Category I - tracings are normal.  Physical Exam Vitals and nursing note reviewed. Exam conducted with a chaperone present.  Constitutional:      Appearance: Normal appearance.  Cardiovascular:     Pulses: Normal pulses.  Pulmonary:     Effort: Pulmonary effort is normal.  Abdominal:     Tenderness: There is generalized abdominal tenderness.  Genitourinary:    General: Normal vulva.  Vulva is no lesion.  Musculoskeletal:        General: Normal range of motion.     Cervical back: Normal range of motion.  Skin:    General: Skin is warm and dry.     Capillary Refill: Capillary refill takes 2 to 3  seconds.  Neurological:     General: No focal deficit present.     Mental Status: She is alert and oriented to person, place, and time. Mental status is at baseline.  Psychiatric:        Mood and Affect: Mood normal.        Behavior: Behavior normal.        Thought Content: Thought content normal.        Judgment: Judgment normal.    Prenatal labs: ABO, Rh: --/--/O POS (10/12 2328) Antibody: NEG (10/12 2328) Rubella: Immune (03/31 0000) RPR: Nonreactive (03/31 0000)  HBsAg: Negative (03/31 0000)  HIV: Non-reactive (03/31 0000)  GBS: Positive/-- (09/29 0000)   Assessment/Plan: 17OH [id number] female at 34 3/7wks with ghtn for IOL - Admit - Now 3/75/-3; attempted AROM with scant fluid return - Pain control prn pt request - Augment with pitocin as indicated - GBS + treat with PCN - monitor BP; labetalol protocol ordered; consier MgSo4 if symptomatic or persistent severe range BP - Anticipate svd    Robin Montoya W Ewan Grau 01/22/2021, 3:31 AM

## 2021-01-22 NOTE — Anesthesia Preprocedure Evaluation (Signed)
Anesthesia Evaluation  Patient identified by MRN, date of birth, ID band Patient awake    Reviewed: Allergy & Precautions, H&P , NPO status , Patient's Chart, lab work & pertinent test results  History of Anesthesia Complications Negative for: history of anesthetic complications  Airway Mallampati: II  TM Distance: >3 FB     Dental   Pulmonary neg pulmonary ROS,    Pulmonary exam normal        Cardiovascular hypertension (pre-eclampsia),  Rhythm:regular Rate:Normal     Neuro/Psych Depression negative neurological ROS  negative psych ROS   GI/Hepatic negative GI ROS, Neg liver ROS,   Endo/Other  negative endocrine ROS  Renal/GU negative Renal ROS  negative genitourinary   Musculoskeletal   Abdominal   Peds  Hematology  (+) Blood dyscrasia, anemia ,   Anesthesia Other Findings   Reproductive/Obstetrics (+) Pregnancy                             Anesthesia Physical Anesthesia Plan  ASA: 3  Anesthesia Plan: Epidural   Post-op Pain Management:    Induction:   PONV Risk Score and Plan:   Airway Management Planned:   Additional Equipment:   Intra-op Plan:   Post-operative Plan:   Informed Consent: I have reviewed the patients History and Physical, chart, labs and discussed the procedure including the risks, benefits and alternatives for the proposed anesthesia with the patient or authorized representative who has indicated his/her understanding and acceptance.       Plan Discussed with:   Anesthesia Plan Comments:         Anesthesia Quick Evaluation

## 2021-01-22 NOTE — Progress Notes (Signed)
OB Progress Note  S: Patient comfortable, feeling mild contractions   O: Today's Vitals   01/22/21 0730 01/22/21 0732 01/22/21 0734 01/22/21 0751  BP: (!) 167/90 (!) 164/88 (!) 159/85 (!) 174/91  Pulse: 76 71 67 70  Resp:   16   Temp:      TempSrc:      SpO2:      Weight:      Height:      PainSc:   6     Body mass index is 32.77 kg/m.  SVE 4/50/-2, AROM forebag for clear fluid  FHR: 135bpm, moderate variability, + accels, no decels Toco: Ctx q 2-3 mins   A/P: 34Y [id number] @ [redacted]w[redacted]d, IOL severe preeclampsia Fetal wellbeing: cat I tracing Severe preeclampsia: initially admitted with gestational hypertension, now with multiple severe range Bps requiring IV labetalol treatment, meets criteria for severe preeclampsia. Start IV mag per protocol. Patient notes she was treated with magnesium after her last delivery as well. Continue IV antihypertensives PRN. Admission labs WNL IOL: continue pitocin, s/p AROM forebag GBS+ continue penicillin Pain control: patient requesting epidural  M. Timothy Lasso, MD

## 2021-01-23 LAB — CBC
HCT: 29 % — ABNORMAL LOW (ref 36.0–46.0)
Hemoglobin: 9.4 g/dL — ABNORMAL LOW (ref 12.0–15.0)
MCH: 26.5 pg (ref 26.0–34.0)
MCHC: 32.4 g/dL (ref 30.0–36.0)
MCV: 81.7 fL (ref 80.0–100.0)
Platelets: 289 10*3/uL (ref 150–400)
RBC: 3.55 MIL/uL — ABNORMAL LOW (ref 3.87–5.11)
RDW: 14.2 % (ref 11.5–15.5)
WBC: 10.9 10*3/uL — ABNORMAL HIGH (ref 4.0–10.5)
nRBC: 0 % (ref 0.0–0.2)

## 2021-01-23 MED ORDER — NIFEDIPINE ER OSMOTIC RELEASE 30 MG PO TB24
60.0000 mg | ORAL_TABLET | Freq: Every day | ORAL | Status: DC
  Administered 2021-01-24: 60 mg via ORAL
  Filled 2021-01-23: qty 2

## 2021-01-23 MED ORDER — NIFEDIPINE ER OSMOTIC RELEASE 30 MG PO TB24
30.0000 mg | ORAL_TABLET | Freq: Once | ORAL | Status: AC
  Administered 2021-01-23: 30 mg via ORAL
  Filled 2021-01-23: qty 1

## 2021-01-23 NOTE — Progress Notes (Signed)
MOB was referred for history of depression/anxiety. * Referral screened out by Clinical Social Worker because none of the following criteria appear to apply: ~ History of anxiety/depression during this pregnancy, or of post-partum depression following prior delivery. ~ Diagnosis of anxiety and/or depression within last 3 years OR * MOB's symptoms currently being treated with medication and/or therapy. MOB has an active Rx for Zoloft.   Please contact the Clinical Social Worker if needs arise, by MOB request, or if MOB scores greater than 9/yes to question 10 on Edinburgh Postpartum Depression Screen.  Bartosz Luginbill Boyd-Gilyard, MSW, LCSW Clinical Social Work [phone]  

## 2021-01-23 NOTE — Progress Notes (Signed)
Patient is eating, ambulating, voiding.  Pain control is good.  Appropriate lochia, no complaints.  No HA, vision change or RUQ pain.  Vitals:   01/23/21 0742 01/23/21 0900 01/23/21 1000 01/23/21 1145  BP: (!) 143/83   (!) 150/91  Pulse: 79   89  Resp: 18 17 18 18   Temp: (!) 97.5 F (36.4 C)   98.3 F (36.8 C)  TempSrc: Oral   Oral  SpO2: 100%   98%  Weight:      Height:        Fundus firm Abd: nontender Ext: no calf tenderness  Lab Results  Component Value Date   WBC 10.9 (H) 01/23/2021   HGB 9.4 (L) 01/23/2021   HCT 29.0 (L) 01/23/2021   MCV 81.7 01/23/2021   PLT 289 01/23/2021    --/--/O POS (10/12 2328)  A/P Post partum day 1 BPs normal and mild for last 24 hrs, severe prior to that, s/p MgSO4 on Procardia XL 30mg  daily, continue Mild acute blood loss anemia- will add add'l Fe SO4 daily  Routine care.  Expect d/c 10/15    

## 2021-01-24 MED ORDER — NIFEDIPINE ER 60 MG PO TB24: 60.0000 mg | ORAL_TABLET | Freq: Every day | ORAL | 0 refills | Status: DC

## 2021-01-24 NOTE — Discharge Summary (Signed)
Postpartum Discharge Summary  Date of Service updated     Patient Name: Robin Montoya DOB: [birth year] MRN: [id number]  Date of admission: 01/21/2021 Delivery date:01/22/2021  Delivering provider: Derl Barrow E  Date of discharge: 01/24/2021  Admitting diagnosis: Post term pregnancy over 40 weeks [O48.0] Intrauterine pregnancy: [redacted]w[redacted]d     Secondary diagnosis:  Active Problems:   Post term pregnancy over 40 weeks  Additional problems: PreE w/ SF    Discharge diagnosis: Term Pregnancy Delivered and Preeclampsia (severe)                                              Post partum procedures: none Augmentation: AROM and Pitocin Complications: None  Hospital course: Onset of Labor With Vaginal Delivery      35 y.o. yo [id number] at [redacted]w[redacted]d was admitted in Latent Labor on 01/21/2021. Found to initially have elevated BP that progressed to SRBP that required IV treatment, completed MgSO4 until 24hrs pp. Patient had an uncomplicated labor course as follows:  Membrane Rupture Time/Date: 3:26 AM ,01/22/2021   Delivery Method:Vaginal, Spontaneous  Episiotomy: None  Lacerations:  None  Patient had an uncomplicated postpartum course.  She is ambulating, tolerating a regular diet, passing flatus, and urinating well. Patient is discharged home in stable condition on 01/24/21.  Newborn Data: Birth date:01/22/2021  Birth time:10:44 AM  Gender:Female  Living status:Living  Apgars:8 ,9  Weight:2780 g   Magnesium Sulfate received: Yes: Seizure prophylaxis   Physical exam  Vitals:   01/23/21 2058 01/23/21 2203 01/24/21 0534 01/24/21 0740  BP: (!) 146/77 130/75 (!) 137/91 139/89  Pulse: 84 70 79   Resp: 16  16   Temp: 98.4 F (36.9 C)  97.8 F (36.6 C)   TempSrc: Oral  Oral   SpO2: 100%  99%   Weight:      Height:       General: alert, cooperative, and no distress Lochia: appropriate Uterine Fundus: firm Incision: N/A DVT Evaluation: No evidence of DVT seen on physical  exam. Negative Homan's sign. No cords or calf tenderness. Labs: Lab Results  Component Value Date   WBC 10.9 (H) 01/23/2021   HGB 9.4 (L) 01/23/2021   HCT 29.0 (L) 01/23/2021   MCV 81.7 01/23/2021   PLT 289 01/23/2021   CMP Latest Ref Rng & Units 01/21/2021  Glucose 70 - 99 mg/dL 97  BUN 6 - 20 mg/dL <6(T)  Creatinine 0.16 - 1.00 mg/dL 0.10  Sodium 932 - 355 mmol/L 135  Potassium 3.5 - 5.1 mmol/L 3.6  Chloride 98 - 111 mmol/L 104  CO2 22 - 32 mmol/L 19(L)  Calcium 8.9 - 10.3 mg/dL 8.9  Total Protein 6.5 - 8.1 g/dL 6.4(L)  Total Bilirubin 0.3 - 1.2 mg/dL 0.5  Alkaline Phos 38 - 126 U/L 142(H)  AST 15 - 41 U/L 16  ALT 0 - 44 U/L 11   Edinburgh Score: Edinburgh Postnatal Depression Scale Screening Tool 01/22/2021  I have been able to laugh and see the funny side of things. 1  I have looked forward with enjoyment to things. 0  I have blamed myself unnecessarily when things went wrong. 0  I have been anxious or worried for no good reason. 0  I have felt scared or panicky for no good reason. 0  Things have been getting on top of me. 0  I have been so unhappy that I have had difficulty sleeping. 0  I have felt sad or miserable. 0  I have been so unhappy that I have been crying. 0  The thought of harming myself has occurred to me. 0  Edinburgh Postnatal Depression Scale Total 1      After visit meds:  Allergies as of 01/24/2021   No Known Allergies      Medication List     STOP taking these medications    acetaminophen 325 MG tablet Commonly known as: TYLENOL   aspirin 81 MG chewable tablet       TAKE these medications    famotidine 20 MG tablet Commonly known as: PEPCID Take 20 mg by mouth at bedtime.   NIFEdipine 60 MG 24 hr tablet Commonly known as: ADALAT CC Take 1 tablet (60 mg total) by mouth daily.   prenatal multivitamin Tabs tablet Take 1 tablet by mouth daily at 12 noon.   sertraline 50 MG tablet Commonly known as: ZOLOFT Take 50 mg by  mouth at bedtime.         Discharge home in stable condition Infant Feeding: Breast Infant Disposition:home with mother Discharge instruction: per After Visit Summary and Postpartum booklet. Activity: Advance as tolerated. Pelvic rest for 6 weeks.  Diet: routine diet Anticipated Birth Control: Unsure Postpartum Appointment:6 weeks Additional Postpartum F/U: BP check 1 week   01/24/2021 Carlisle Cater, MD

## 2021-01-24 NOTE — Progress Notes (Signed)
Patient is eating, ambulating, voiding.  Pain control is good.  Appropriate lochia, no complaints.  No HA, vision change or RUQ pain.  Vitals:   01/23/21 2058 01/23/21 2203 01/24/21 0534 01/24/21 0740  BP: (!) 146/77 130/75 (!) 137/91 139/89  Pulse: 84 70 79   Resp: 16  16   Temp: 98.4 F (36.9 C)  97.8 F (36.6 C)   TempSrc: Oral  Oral   SpO2: 100%  99%   Weight:      Height:        Fundus firm Abd: nontender Ext: no calf tenderness  Lab Results  Component Value Date   WBC 10.9 (H) 01/23/2021   HGB 9.4 (L) 01/23/2021   HCT 29.0 (L) 01/23/2021   MCV 81.7 01/23/2021   PLT 289 01/23/2021    --/--/O POS (10/12 2328)  A/P Post partum day 2 BPs normal and mild for last 24 hrs, severe prior to that, s/p MgSO4 on Procardia XL 30mg  daily, continue Mild acute blood loss anemia- continue Fe SO4 daily  Routine care.  DC home today  Jarrell Armond

## 2021-01-27 ENCOUNTER — Inpatient Hospital Stay (HOSPITAL_COMMUNITY)
Admission: AD | Admit: 2021-01-27 | Payer: Commercial Managed Care - PPO | Source: Home / Self Care | Admitting: Obstetrics and Gynecology

## 2021-01-27 ENCOUNTER — Inpatient Hospital Stay (HOSPITAL_COMMUNITY): Payer: Commercial Managed Care - PPO

## 2021-02-03 ENCOUNTER — Telehealth (HOSPITAL_COMMUNITY): Payer: Self-pay | Admitting: *Deleted

## 2021-02-03 NOTE — Telephone Encounter (Signed)
Attempted hospital discharge follow-up call. Left message for patient to return RN call. Deforest Hoyles, RN, 02/03/21, [phone]

## 2022-06-16 ENCOUNTER — Ambulatory Visit
Admission: EM | Admit: 2022-06-16 | Discharge: 2022-06-16 | Disposition: A | Discharge status: Home / Self Care | Payer: BC Managed Care – PPO | Attending: Physician Assistant | Admitting: Physician Assistant

## 2022-06-16 ENCOUNTER — Encounter: Payer: Self-pay | Admitting: Emergency Medicine

## 2022-06-16 DIAGNOSIS — F419 Anxiety disorder, unspecified: Secondary | ICD-10-CM | POA: Insufficient documentation

## 2022-06-16 DIAGNOSIS — J029 Acute pharyngitis, unspecified: Secondary | ICD-10-CM | POA: Diagnosis present

## 2022-06-16 LAB — GROUP A STREP BY PCR: Group A Strep by PCR: NOT DETECTED

## 2022-06-16 MED ORDER — ALPRAZOLAM 0.5 MG PO TABS: 0.5000 mg | ORAL_TABLET | Freq: Two times a day (BID) | ORAL | 0 refills | Status: AC | PRN

## 2022-06-16 NOTE — Discharge Instructions (Signed)
-  Strep is negative.  You have viral pharyngitis. - I sent a few tablets of the Xanax as needed for strep but you should get further refills from your PCP.

## 2022-06-16 NOTE — ED Provider Notes (Signed)
[facility]    CSN: [id number] Arrival date & time: 06/16/22  1502      History   Chief Complaint Chief Complaint  Patient presents with   Sore Throat    Entered by patient    HPI Robin Montoya is a 37 y.o. female presenting for sore throat that began yesterday.  Reports she has a sore in the back of her throat as well.  She is concerned about tonsillitis.  She denies fever, fatigue, cough, congestion.  Reports she has been under a lot more stress recently.  States she was prescribed Xanax previously but has been out of this medication.  She would like a refill if possible.  Has not been taking any OTC meds for her current symptoms.  No other complaints.  HPI  Past Medical History:  Diagnosis Date   Depression    Heart murmur    insufficient   Hx of varicella     Patient Active Problem List   Diagnosis Date Noted   Post term pregnancy over 40 weeks 01/21/2021   [redacted] weeks gestation of pregnancy 05/18/2019   FATIGUE 09/01/2009   ALLERGIC RHINITIS 01/06/2007   ACNE NEC 01/06/2007    Past Surgical History:  Procedure Laterality Date   NO PAST SURGERIES      OB History     Gravida  4   Para  3   Term  3   Preterm  0   AB  1   Living  3      SAB  1   IAB  0   Ectopic  0   Multiple  0   Live Births  3            Home Medications    Prior to Admission medications   Medication Sig Start Date End Date Taking? Authorizing Provider  ALPRAZolam Duanne Moron) 0.5 MG tablet Take 1 tablet (0.5 mg total) by mouth 2 (two) times daily as needed for up to 5 days for anxiety. 06/16/22 06/21/22 Yes Danton Clap, PA-C  famotidine (PEPCID) 20 MG tablet Take 20 mg by mouth at bedtime.    [provider]  NIFEdipine (ADALAT CC) 60 MG 24 hr tablet Take 1 tablet (60 mg total) by mouth daily. 01/24/21   Shivaji, Melida Quitter, MD  Prenatal Vit-Fe Fumarate-FA (PRENATAL MULTIVITAMIN) TABS tablet Take 1 tablet by mouth daily at 12 noon.    [provider]  sertraline (ZOLOFT) 50 MG tablet Take 50 mg by mouth at bedtime.    [provider]    Family History Family History  Problem Relation Age of Onset   Kidney Stones Mother    Kidney Stones Father    Depression Brother    Bipolar disorder Brother    Diabetes Maternal Grandfather    Heart disease Maternal Grandfather        triple bypass   Hypertension Maternal Grandfather    Cancer Maternal Grandmother        colon   Hypertension Maternal Grandmother     Social History Social History   Tobacco Use   Smoking status: Never   Smokeless tobacco: Never  Vaping Use   Vaping Use: Never used  Substance Use Topics   Alcohol use: No   Drug use: No     Allergies   Patient has no known allergies.   Review of Systems Review of Systems  Constitutional:  Negative for chills, diaphoresis, fatigue and fever.  HENT:  Positive for mouth sores and sore throat. Negative for congestion, ear pain, rhinorrhea, sinus pressure and sinus pain.   Respiratory:  Negative for cough and shortness of breath.   Gastrointestinal:  Negative for abdominal pain, nausea and vomiting.  Musculoskeletal:  Negative for arthralgias and myalgias.  Skin:  Negative for rash.  Neurological:  Negative for weakness and headaches.  Hematological:  Negative for adenopathy.  Psychiatric/Behavioral:  The patient is nervous/anxious.      Physical Exam Triage Vital Signs ED Triage Vitals  Enc Vitals Group     BP      Pulse      Resp      Temp      Temp src      SpO2      Weight      Height      Head Circumference      Peak Flow      Pain Score      Pain Loc      Pain Edu?      Excl. in [place]?    No data found.  Updated Vital Signs BP 111/74 (BP Location: Left Arm)   Pulse (!) 56   Temp 98.1 F (36.7 C) (Oral)   Resp 16   LMP 05/28/2022   SpO2 100%       Physical Exam Vitals and nursing note reviewed.  Constitutional:      General: She is not in acute distress.     Appearance: Normal appearance. She is not ill-appearing or toxic-appearing.  HENT:     Head: Normocephalic and atraumatic.     Nose: Nose normal.     Mouth/Throat:     Mouth: Mucous membranes are moist.     Pharynx: Oropharynx is clear. Posterior oropharyngeal erythema present.     Comments: Tiny aphthous ulcer left posterior pharynx Eyes:     General: No scleral icterus.       Right eye: No discharge.        Left eye: No discharge.     Conjunctiva/sclera: Conjunctivae normal.  Cardiovascular:     Rate and Rhythm: Regular rhythm. Bradycardia present.     Heart sounds: Normal heart sounds.  Pulmonary:     Effort: Pulmonary effort is normal. No respiratory distress.     Breath sounds: Normal breath sounds.  Musculoskeletal:     Cervical back: Neck supple.  Skin:    General: Skin is dry.  Neurological:     General: No focal deficit present.     Mental Status: She is alert. Mental status is at baseline.     Motor: No weakness.     Gait: Gait normal.  Psychiatric:        Mood and Affect: Mood normal.        Behavior: Behavior normal.        Thought Content: Thought content normal.      UC Treatments / Results  Labs (all labs ordered are listed, but only abnormal results are displayed) Labs Reviewed  GROUP A STREP BY PCR    EKG   Radiology No results found.  Procedures Procedures (including critical care time)  Medications Ordered in UC Medications - No data to display  Initial Impression / Assessment and Plan / UC Course  I have reviewed the triage vital signs and the nursing notes.  Pertinent labs & imaging results that were available during my care of the patient were reviewed by me and considered in my medical decision making (  see chart for details).   37 year old female presents for sore throat and painful mouth lesion since yesterday.  No fever or other symptoms.  Strep test performed and negative.  Advised patient she has viral pharyngitis and an aphthous  ulcer.  Supportive care encouraged with use of Chloraseptic spray, I Profen Tylenol.  Patient also reports that she has been under more stress recently.  Has taken Xanax in the past but is out of this medication.  Says she has not had it filled in many months.  Advised patient I can only give her a few tablets and she will need to get further refills from PCP.   Final Clinical Impressions(s) / UC Diagnoses   Final diagnoses:  Viral pharyngitis  Anxiety     Discharge Instructions      -Strep is negative.  You have viral pharyngitis. - I sent a few tablets of the Xanax as needed for strep but you should get further refills from your PCP.     ED Prescriptions     Medication Sig Dispense Auth. Provider   ALPRAZolam Duanne Moron) 0.5 MG tablet Take 1 tablet (0.5 mg total) by mouth 2 (two) times daily as needed for up to 5 days for anxiety. 10 tablet Danton Clap, PA-C      I have reviewed the PDMP during this encounter.   Danton Clap, PA-C 06/16/22 1651

## 2022-06-16 NOTE — ED Triage Notes (Signed)
Pt presents with a sore throat since yesterday.

## 2023-04-01 LAB — OB RESULTS CONSOLE RPR: RPR: NONREACTIVE

## 2023-04-01 LAB — OB RESULTS CONSOLE HEPATITIS B SURFACE ANTIGEN: Hepatitis B Surface Ag: NEGATIVE

## 2023-04-01 LAB — OB RESULTS CONSOLE GC/CHLAMYDIA
Chlamydia: NEGATIVE
Neisseria Gonorrhea: NEGATIVE

## 2023-04-01 LAB — OB RESULTS CONSOLE RUBELLA ANTIBODY, IGM: Rubella: IMMUNE

## 2023-04-01 LAB — OB RESULTS CONSOLE HIV ANTIBODY (ROUTINE TESTING): HIV: NONREACTIVE

## 2023-04-01 LAB — OB RESULTS CONSOLE GBS: GBS: POSITIVE

## 2023-04-13 NOTE — L&D Delivery Note (Signed)
 DELIVERY NOTE  Pt complete and at +2 station with urge to push. Notified fo this while I was en route to hospital, faculty asked to stand by. When I presented to room, viable baby girl had just been delivered, ROA, no nuchal (delivery by [facility] Fellow Dr Nicholaus). I took over at this time. Cord was then clamped and cut by FOB. Cord blood obtained, 3VC. Baby had a vigorous spontaneous cry noted. Placenta then delivered at 0742 intact. Fundal massage performed and pitocin  per protocol. Fundus firm. The following lacerations were noted: right labial abrasion, hemostatic, EBL 100cc, QBL pending. Mother and baby stable. Counts correct. Placenta to path for PreE w/ SF  Infant time: 0738 Gender: female Placenta time: 0742 Apgars: 9/9 Weight: pending skin-to-skin

## 2023-04-13 NOTE — L&D Delivery Note (Signed)
 OB/GYN Faculty Practice Delivery Note  Robin Montoya is a 38 y.o. [id number] s/p SVD at [redacted]w[redacted]d.  ROM: 1h 69m with clear fluid GBS Status: Positive/-- (12/20 0000) Maximum Maternal Temperature: Temp (24hrs), Avg:98.4 F (36.9 C), Min:98.1 F (36.7 C), Max:98.6 F (37 C)  Delivery Date/Time: 10/12/23 0738 Delivery: Called to room for precipitous delivery. Head delivered OA to ROA. No nuchal cord present. Shoulder and body delivered in usual fashion. Infant with spontaneous cry, placed on mother's abdomen, dried and stimulated. Dr. Sudie then entered room and took over care.  [name], MD [facility] Fellow, [facility], [facility] 10/12/2023, 8:24 AM

## 2023-09-30 ENCOUNTER — Other Ambulatory Visit: Payer: Self-pay | Admitting: Obstetrics and Gynecology

## 2023-09-30 DIAGNOSIS — Z3490 Encounter for supervision of normal pregnancy, unspecified, unspecified trimester: Secondary | ICD-10-CM

## 2023-10-11 ENCOUNTER — Encounter (HOSPITAL_COMMUNITY): Payer: Self-pay | Admitting: *Deleted

## 2023-10-11 ENCOUNTER — Inpatient Hospital Stay (HOSPITAL_COMMUNITY)
Admission: AD | Admit: 2023-10-11 | Discharge: 2023-10-13 | DRG: 806 | Disposition: A | Discharge status: Home / Self Care | Attending: Obstetrics and Gynecology | Admitting: Obstetrics and Gynecology

## 2023-10-11 DIAGNOSIS — O99344 Other mental disorders complicating childbirth: Secondary | ICD-10-CM | POA: Diagnosis present

## 2023-10-11 DIAGNOSIS — Z3490 Encounter for supervision of normal pregnancy, unspecified, unspecified trimester: Secondary | ICD-10-CM

## 2023-10-11 DIAGNOSIS — Z3A37 37 weeks gestation of pregnancy: Secondary | ICD-10-CM

## 2023-10-11 DIAGNOSIS — O9882 Other maternal infectious and parasitic diseases complicating childbirth: Secondary | ICD-10-CM | POA: Diagnosis present

## 2023-10-11 DIAGNOSIS — O99824 Streptococcus B carrier state complicating childbirth: Secondary | ICD-10-CM | POA: Diagnosis present

## 2023-10-11 DIAGNOSIS — Z8249 Family history of ischemic heart disease and other diseases of the circulatory system: Secondary | ICD-10-CM

## 2023-10-11 DIAGNOSIS — O1413 Severe pre-eclampsia, third trimester: Principal | ICD-10-CM

## 2023-10-11 DIAGNOSIS — O9902 Anemia complicating childbirth: Secondary | ICD-10-CM | POA: Diagnosis present

## 2023-10-11 DIAGNOSIS — Z3A38 38 weeks gestation of pregnancy: Secondary | ICD-10-CM

## 2023-10-11 DIAGNOSIS — O1414 Severe pre-eclampsia complicating childbirth: Principal | ICD-10-CM | POA: Diagnosis present

## 2023-10-11 DIAGNOSIS — F32A Depression, unspecified: Secondary | ICD-10-CM | POA: Diagnosis present

## 2023-10-11 DIAGNOSIS — Z833 Family history of diabetes mellitus: Secondary | ICD-10-CM

## 2023-10-11 DIAGNOSIS — R8271 Bacteriuria: Secondary | ICD-10-CM | POA: Diagnosis present

## 2023-10-11 LAB — COMPREHENSIVE METABOLIC PANEL WITH GFR
ALT: 10 U/L (ref 0–44)
AST: 15 U/L (ref 15–41)
Albumin: 3 g/dL — ABNORMAL LOW (ref 3.5–5.0)
Alkaline Phosphatase: 169 U/L — ABNORMAL HIGH (ref 38–126)
Anion gap: 8 (ref 5–15)
BUN: 6 mg/dL (ref 6–20)
CO2: 21 mmol/L — ABNORMAL LOW (ref 22–32)
Calcium: 9.3 mg/dL (ref 8.9–10.3)
Chloride: 104 mmol/L (ref 98–111)
Creatinine, Ser: 0.68 mg/dL (ref 0.44–1.00)
GFR, Estimated: >60 mL/min (ref >60)
Glucose, Bld: 86 mg/dL (ref 70–99)
Potassium: 3.7 mmol/L (ref 3.5–5.1)
Sodium: 133 mmol/L — ABNORMAL LOW (ref 135–145)
Total Bilirubin: 0.4 mg/dL (ref 0.0–1.2)
Total Protein: 6.5 g/dL (ref 6.5–8.1)

## 2023-10-11 LAB — PROTEIN / CREATININE RATIO, URINE
Creatinine, Urine: 64 mg/dL
Total Protein, Urine: <6 mg/dL

## 2023-10-11 MED ORDER — HYDRALAZINE HCL 20 MG/ML IJ SOLN: 10.0000 mg | INTRAMUSCULAR | Status: DC | PRN

## 2023-10-11 MED ORDER — LABETALOL HCL 5 MG/ML IV SOLN
20.0000 mg | INTRAVENOUS | Status: DC | PRN
  Administered 2023-10-11: 20 mg via INTRAVENOUS
  Filled 2023-10-11: qty 4

## 2023-10-11 MED ORDER — LABETALOL HCL 5 MG/ML IV SOLN: 40.0000 mg | INTRAVENOUS | Status: DC | PRN

## 2023-10-11 MED ORDER — LABETALOL HCL 5 MG/ML IV SOLN: 80.0000 mg | INTRAVENOUS | Status: DC | PRN

## 2023-10-11 NOTE — MAU Provider Note (Signed)
  History     CSN: [id number]  Arrival date and time: 10/11/23 2155   Event Date/Time   First Provider Initiated Contact with Patient 10/11/23 2230      Chief Complaint  Patient presents with   Contractions    S Ms. Robin Montoya is a 38 y.o. H4E6986 at [redacted]w[redacted]d who receives care at  [facility].  She presents today for labor check.  Patient with severe blood pressures upon arrival x 2. Provider notified and to bedside to assess.  Patient reports h/o PreE in previous pregnancies.  Patient denies HA, visual disturbances, and RUQ pain.  Patient endorses fetal movement and contractions.  Reports cervical exam with membrane stripping in office today and was 3cm.   O BP (!) 160/94   Pulse 61   Temp 98.6 F (37 C)   Resp 17   Ht 5' 3 (1.6 m)   Wt 79.8 kg   SpO2 100%   BMI 31.18 kg/m   Vitals:   10/11/23 2208 10/11/23 2215 10/11/23 2228 10/11/23 2249  BP:  (!) 165/92 (!) 160/94 (!) 154/83  Pulse: 66  61 69  Resp: 17     Temp: 98.6 F (37 C)     SpO2: 100%     Weight: 79.8 kg     Height: 5' 3 (1.6 m)      135 bpm, Mod Var, -Decels, +Accels Ctx Q2-24min  Physical Exam Vitals reviewed. Exam conducted with a chaperone present Reggy, RN & [employer], [employer] fundraiser).  Constitutional:      Appearance: Normal appearance.  HENT:     Head: Normocephalic and atraumatic.  Eyes:     Conjunctiva/sclera: Conjunctivae normal.  Cardiovascular:     Rate and Rhythm: Normal rate.  Pulmonary:     Effort: Pulmonary effort is normal. No respiratory distress.  Abdominal:     Comments: Gravid, Appears AGA  Musculoskeletal:        General: Normal range of motion.     Cervical back: Normal range of motion.     Right lower leg: No edema.     Left lower leg: No edema.  Skin:    General: Skin is warm and dry.  Neurological:     Mental Status: She is alert and oriented to person, place, and time.  Psychiatric:        Mood and Affect: Mood normal.        Behavior: Behavior normal.      A 38 year old G5P3013 SIUP at 37.6 weeks Cat I FT Severe GHTN vs PreEclampsia   P -PreE orders placed. -Patient denies h/o asthma.   -Will order labetalol  IV for bp mgmt. -Nurse instructed to contact primary ob for admission orders.   Harlene LITTIE Duncans MSN, CNM 10/11/2023, 10:31 PM

## 2023-10-11 NOTE — MAU Note (Signed)
 Robin Montoya is a 38 y.o. at [redacted]w[redacted]d here in MAU reporting cramping throughout the day. Ctxs started about 2045 and have been consistent. Had membrane sweep this am  and was 3cm. Reports good FM and denies LOF or VB. Denies any pregnancy concerns.   LMP: n/a  Onset of complaint: this am Pain score: 6 Vitals:   10/11/23 2208  Pulse: 66  Resp: 17  Temp: 98.6 F (37 C)  SpO2: 100%     FHT: 145  Lab orders placed from triage: labor eval

## 2023-10-11 NOTE — MAU Note (Signed)

## 2023-10-12 ENCOUNTER — Inpatient Hospital Stay (HOSPITAL_COMMUNITY): Admitting: Anesthesiology

## 2023-10-12 ENCOUNTER — Encounter (HOSPITAL_COMMUNITY): Payer: Self-pay | Admitting: Student

## 2023-10-12 ENCOUNTER — Other Ambulatory Visit: Payer: Self-pay

## 2023-10-12 DIAGNOSIS — O99344 Other mental disorders complicating childbirth: Secondary | ICD-10-CM | POA: Diagnosis present

## 2023-10-12 DIAGNOSIS — O1414 Severe pre-eclampsia complicating childbirth: Secondary | ICD-10-CM | POA: Diagnosis present

## 2023-10-12 DIAGNOSIS — F32A Depression, unspecified: Secondary | ICD-10-CM | POA: Diagnosis present

## 2023-10-12 DIAGNOSIS — R8271 Bacteriuria: Secondary | ICD-10-CM | POA: Diagnosis present

## 2023-10-12 DIAGNOSIS — O26893 Other specified pregnancy related conditions, third trimester: Secondary | ICD-10-CM | POA: Diagnosis present

## 2023-10-12 DIAGNOSIS — Z3A38 38 weeks gestation of pregnancy: Secondary | ICD-10-CM | POA: Diagnosis not present

## 2023-10-12 DIAGNOSIS — O9882 Other maternal infectious and parasitic diseases complicating childbirth: Secondary | ICD-10-CM | POA: Diagnosis present

## 2023-10-12 DIAGNOSIS — Z8249 Family history of ischemic heart disease and other diseases of the circulatory system: Secondary | ICD-10-CM | POA: Diagnosis not present

## 2023-10-12 DIAGNOSIS — O99824 Streptococcus B carrier state complicating childbirth: Secondary | ICD-10-CM | POA: Diagnosis present

## 2023-10-12 DIAGNOSIS — Z833 Family history of diabetes mellitus: Secondary | ICD-10-CM | POA: Diagnosis not present

## 2023-10-12 DIAGNOSIS — O1413 Severe pre-eclampsia, third trimester: Principal | ICD-10-CM | POA: Diagnosis present

## 2023-10-12 DIAGNOSIS — O9902 Anemia complicating childbirth: Secondary | ICD-10-CM | POA: Diagnosis present

## 2023-10-12 LAB — TYPE AND SCREEN
ABO/RH(D): O POS
Antibody Screen: NEGATIVE

## 2023-10-12 LAB — CBC
HCT: 38.6 % (ref 36.0–46.0)
HCT: 35.1 % — ABNORMAL LOW (ref 36.0–46.0)
Hemoglobin: 12.6 g/dL (ref 12.0–15.0)
Hemoglobin: 11.5 g/dL — ABNORMAL LOW (ref 12.0–15.0)
MCH: 28.9 pg (ref 26.0–34.0)
MCH: 29 pg (ref 26.0–34.0)
MCHC: 32.6 g/dL (ref 30.0–36.0)
MCHC: 32.8 g/dL (ref 30.0–36.0)
MCV: 88.5 fL (ref 80.0–100.0)
MCV: 88.4 fL (ref 80.0–100.0)
Platelets: 266 10*3/uL (ref 150–400)
Platelets: 233 10*3/uL (ref 150–400)
RBC: 4.36 MIL/uL (ref 3.87–5.11)
RBC: 3.97 MIL/uL (ref 3.87–5.11)
RDW: 18.8 % — ABNORMAL HIGH (ref 11.5–15.5)
RDW: 19 % — ABNORMAL HIGH (ref 11.5–15.5)
WBC: 8.8 10*3/uL (ref 4.0–10.5)
WBC: 9.7 10*3/uL (ref 4.0–10.5)
nRBC: 0 % (ref 0.0–0.2)
nRBC: 0 % (ref 0.0–0.2)

## 2023-10-12 LAB — RPR: RPR Ser Ql: NONREACTIVE

## 2023-10-12 MED ORDER — NIFEDIPINE ER OSMOTIC RELEASE 30 MG PO TB24
30.0000 mg | ORAL_TABLET | Freq: Every day | ORAL | Status: DC
  Administered 2023-10-12: 30 mg via ORAL
  Filled 2023-10-12: qty 1

## 2023-10-12 MED ORDER — PENICILLIN G POT IN DEXTROSE 60000 UNIT/ML IV SOLN
3.0000 10*6.[IU] | INTRAVENOUS | Status: DC
  Administered 2023-10-12: 3 10*6.[IU] via INTRAVENOUS
  Filled 2023-10-12: qty 50

## 2023-10-12 MED ORDER — FENTANYL CITRATE (PF) 100 MCG/2ML IJ SOLN: 50.0000 ug | INTRAMUSCULAR | Status: DC | PRN

## 2023-10-12 MED ORDER — IBUPROFEN 600 MG PO TABS
600.0000 mg | ORAL_TABLET | Freq: Four times a day (QID) | ORAL | Status: DC
  Administered 2023-10-12 – 2023-10-13 (×5): 600 mg via ORAL
  Filled 2023-10-12 (×5): qty 1

## 2023-10-12 MED ORDER — PHENYLEPHRINE 80 MCG/ML (10ML) SYRINGE FOR IV PUSH (FOR BLOOD PRESSURE SUPPORT): 80.0000 ug | PREFILLED_SYRINGE | INTRAVENOUS | Status: DC | PRN

## 2023-10-12 MED ORDER — DIPHENHYDRAMINE HCL 25 MG PO CAPS: 25.0000 mg | ORAL_CAPSULE | Freq: Four times a day (QID) | ORAL | Status: DC | PRN

## 2023-10-12 MED ORDER — LABETALOL HCL 5 MG/ML IV SOLN: 40.0000 mg | INTRAVENOUS | Status: DC | PRN

## 2023-10-12 MED ORDER — LIDOCAINE HCL (PF) 1 % IJ SOLN: 30.0000 mL | INTRAMUSCULAR | Status: DC | PRN

## 2023-10-12 MED ORDER — MAGNESIUM SULFATE 40 GM/1000ML IV SOLN: 2.0000 g/h | INTRAVENOUS | Status: DC

## 2023-10-12 MED ORDER — COCONUT OIL OIL: 1.0000 | TOPICAL_OIL | Status: DC | PRN

## 2023-10-12 MED ORDER — OXYCODONE-ACETAMINOPHEN 5-325 MG PO TABS: 1.0000 | ORAL_TABLET | ORAL | Status: DC | PRN

## 2023-10-12 MED ORDER — SERTRALINE HCL 50 MG PO TABS
50.0000 mg | ORAL_TABLET | Freq: Every day | ORAL | Status: DC
  Administered 2023-10-12: 50 mg via ORAL
  Filled 2023-10-12: qty 1

## 2023-10-12 MED ORDER — FENTANYL-BUPIVACAINE-NACL 0.5-0.125-0.9 MG/250ML-% EP SOLN: 12.0000 mL/h | EPIDURAL | Status: DC | PRN

## 2023-10-12 MED ORDER — FENTANYL-BUPIVACAINE-NACL 0.5-0.125-0.9 MG/250ML-% EP SOLN
12.0000 mL/h | EPIDURAL | Status: DC | PRN
  Administered 2023-10-12: 12 mL/h via EPIDURAL
  Filled 2023-10-12: qty 250

## 2023-10-12 MED ORDER — LIDOCAINE HCL (PF) 1 % IJ SOLN
INTRAMUSCULAR | Status: DC | PRN
Start: 2023-10-12 — End: 2023-10-12
  Administered 2023-10-12: 8 mL via EPIDURAL

## 2023-10-12 MED ORDER — PRENATAL MULTIVITAMIN CH
1.0000 | ORAL_TABLET | Freq: Every day | ORAL | Status: DC
Start: 2023-10-12 — End: 2023-10-13
  Administered 2023-10-12 – 2023-10-13 (×2): 1 via ORAL
  Filled 2023-10-12 (×2): qty 1

## 2023-10-12 MED ORDER — SIMETHICONE 80 MG PO CHEW: 80.0000 mg | CHEWABLE_TABLET | ORAL | Status: DC | PRN

## 2023-10-12 MED ORDER — ONDANSETRON HCL 4 MG PO TABS: 4.0000 mg | ORAL_TABLET | ORAL | Status: DC | PRN

## 2023-10-12 MED ORDER — EPHEDRINE 5 MG/ML INJ: 10.0000 mg | INTRAVENOUS | Status: DC | PRN

## 2023-10-12 MED ORDER — LACTATED RINGERS IV SOLN: INTRAVENOUS | Status: DC

## 2023-10-12 MED ORDER — LACTATED RINGERS IV SOLN: 500.0000 mL | Freq: Once | INTRAVENOUS | Status: DC

## 2023-10-12 MED ORDER — NIFEDIPINE ER OSMOTIC RELEASE 30 MG PO TB24
30.0000 mg | ORAL_TABLET | Freq: Once | ORAL | Status: AC
  Administered 2023-10-12: 30 mg via ORAL
  Filled 2023-10-12: qty 1

## 2023-10-12 MED ORDER — PENICILLIN G POTASSIUM 5000000 UNITS IJ SOLR
5.0000 10*6.[IU] | Freq: Once | INTRAMUSCULAR | Status: AC
  Administered 2023-10-12: 5 10*6.[IU] via INTRAVENOUS
  Filled 2023-10-12: qty 5

## 2023-10-12 MED ORDER — ZOLPIDEM TARTRATE 5 MG PO TABS: 5.0000 mg | ORAL_TABLET | Freq: Every evening | ORAL | Status: DC | PRN

## 2023-10-12 MED ORDER — LACTATED RINGERS IV SOLN: 500.0000 mL | INTRAVENOUS | Status: DC | PRN

## 2023-10-12 MED ORDER — TETANUS-DIPHTH-ACELL PERTUSSIS 5-2.5-18.5 LF-MCG/0.5 IM SUSY: 0.5000 mL | PREFILLED_SYRINGE | Freq: Once | INTRAMUSCULAR | Status: DC

## 2023-10-12 MED ORDER — MAGNESIUM SULFATE BOLUS VIA INFUSION
4.0000 g | Freq: Once | INTRAVENOUS | Status: DC
  Filled 2023-10-12: qty 1000

## 2023-10-12 MED ORDER — ONDANSETRON HCL 4 MG/2ML IJ SOLN: 4.0000 mg | Freq: Four times a day (QID) | INTRAMUSCULAR | Status: DC | PRN

## 2023-10-12 MED ORDER — NIFEDIPINE ER OSMOTIC RELEASE 30 MG PO TB24
60.0000 mg | ORAL_TABLET | Freq: Every day | ORAL | Status: DC
  Administered 2023-10-13: 60 mg via ORAL
  Filled 2023-10-12: qty 2

## 2023-10-12 MED ORDER — WITCH HAZEL-GLYCERIN EX PADS: 1.0000 | MEDICATED_PAD | CUTANEOUS | Status: DC | PRN

## 2023-10-12 MED ORDER — LACTATED RINGERS IV SOLN
500.0000 mL | Freq: Once | INTRAVENOUS | Status: AC
  Administered 2023-10-12: 500 mL via INTRAVENOUS

## 2023-10-12 MED ORDER — ACETAMINOPHEN 325 MG PO TABS: 650.0000 mg | ORAL_TABLET | ORAL | Status: DC | PRN

## 2023-10-12 MED ORDER — DIBUCAINE (PERIANAL) 1 % EX OINT
1.0000 | TOPICAL_OINTMENT | CUTANEOUS | Status: DC | PRN
Start: 2023-10-12 — End: 2023-10-13

## 2023-10-12 MED ORDER — OXYTOCIN-SODIUM CHLORIDE 30-0.9 UT/500ML-% IV SOLN
1.0000 m[IU]/min | INTRAVENOUS | Status: DC
  Administered 2023-10-12: 2 m[IU]/min via INTRAVENOUS
  Filled 2023-10-12: qty 500

## 2023-10-12 MED ORDER — SENNOSIDES-DOCUSATE SODIUM 8.6-50 MG PO TABS
2.0000 | ORAL_TABLET | Freq: Every day | ORAL | Status: DC
  Administered 2023-10-13: 2 via ORAL
  Filled 2023-10-12: qty 2

## 2023-10-12 MED ORDER — LABETALOL HCL 5 MG/ML IV SOLN: 80.0000 mg | INTRAVENOUS | Status: DC | PRN

## 2023-10-12 MED ORDER — HYDRALAZINE HCL 20 MG/ML IJ SOLN: 10.0000 mg | INTRAMUSCULAR | Status: DC | PRN

## 2023-10-12 MED ORDER — ONDANSETRON HCL 4 MG/2ML IJ SOLN: 4.0000 mg | INTRAMUSCULAR | Status: DC | PRN

## 2023-10-12 MED ORDER — SOD CITRATE-CITRIC ACID 500-334 MG/5ML PO SOLN: 30.0000 mL | ORAL | Status: DC | PRN

## 2023-10-12 MED ORDER — OXYTOCIN-SODIUM CHLORIDE 30-0.9 UT/500ML-% IV SOLN
2.5000 [IU]/h | INTRAVENOUS | Status: DC
  Administered 2023-10-12: 2.5 [IU]/h via INTRAVENOUS

## 2023-10-12 MED ORDER — TERBUTALINE SULFATE 1 MG/ML IJ SOLN
0.2500 mg | Freq: Once | INTRAMUSCULAR | Status: DC | PRN
Start: 2023-10-12 — End: 2023-10-13

## 2023-10-12 MED ORDER — OXYCODONE-ACETAMINOPHEN 5-325 MG PO TABS: 2.0000 | ORAL_TABLET | ORAL | Status: DC | PRN

## 2023-10-12 MED ORDER — MISOPROSTOL 25 MCG QUARTER TABLET: 25.0000 ug | ORAL_TABLET | ORAL | Status: DC | PRN

## 2023-10-12 MED ORDER — DIPHENHYDRAMINE HCL 50 MG/ML IJ SOLN: 12.5000 mg | INTRAMUSCULAR | Status: DC | PRN

## 2023-10-12 MED ORDER — DIPHENHYDRAMINE HCL 50 MG/ML IJ SOLN
12.5000 mg | INTRAMUSCULAR | Status: DC | PRN
Start: 2023-10-12 — End: 2023-10-12

## 2023-10-12 MED ORDER — OXYTOCIN BOLUS FROM INFUSION
333.0000 mL | Freq: Once | INTRAVENOUS | Status: AC
  Administered 2023-10-12: 333 mL via INTRAVENOUS

## 2023-10-12 MED ORDER — TERBUTALINE SULFATE 1 MG/ML IJ SOLN: 0.2500 mg | Freq: Once | INTRAMUSCULAR | Status: DC | PRN

## 2023-10-12 MED ORDER — LABETALOL HCL 5 MG/ML IV SOLN
20.0000 mg | INTRAVENOUS | Status: DC | PRN
  Administered 2023-10-13: 20 mg via INTRAVENOUS
  Filled 2023-10-12: qty 4

## 2023-10-12 MED ORDER — BENZOCAINE-MENTHOL 20-0.5 % EX AERO
1.0000 | INHALATION_SPRAY | CUTANEOUS | Status: DC | PRN
Start: 2023-10-12 — End: 2023-10-13

## 2023-10-12 NOTE — Progress Notes (Signed)
 BP 133/81 (BP Location: Right Arm)   Pulse (!) 55   Temp 98.3 F (36.8 C) (Oral)   Resp 18   Ht 5' 3 (1.6 m)   Wt 79.8 kg   SpO2 99%   Breastfeeding Unknown   BMI 31.18 kg/m  Normotensive since delivery, continue PO Procardia  30XL every day. Patient continues to declines pp magnesium  sulfate for seizure ppx

## 2023-10-12 NOTE — Anesthesia Preprocedure Evaluation (Signed)
 Anesthesia Evaluation  Patient identified by MRN, date of birth, ID band Patient awake    Reviewed: Allergy & Precautions, H&P , NPO status , Patient's Chart, lab work & pertinent test results, reviewed documented beta blocker date and time   Airway Mallampati: II  TM Distance: >3 FB Neck ROM: full    Dental no notable dental hx.    Pulmonary neg pulmonary ROS   Pulmonary exam normal breath sounds clear to auscultation       Cardiovascular hypertension, negative cardio ROS Normal cardiovascular exam+ Valvular Problems/Murmurs  Rhythm:regular Rate:Normal     Neuro/Psych  PSYCHIATRIC DISORDERS  Depression    negative neurological ROS  negative psych ROS   GI/Hepatic negative GI ROS, Neg liver ROS,,,  Endo/Other  negative endocrine ROS    Renal/GU negative Renal ROS  negative genitourinary   Musculoskeletal   Abdominal   Peds  Hematology negative hematology ROS (+)   Anesthesia Other Findings   Reproductive/Obstetrics (+) Pregnancy                              Anesthesia Physical Anesthesia Plan  ASA: 2  Anesthesia Plan: Epidural   Post-op Pain Management: Minimal or no pain anticipated   Induction:   PONV Risk Score and Plan: 2 and Ondansetron   Airway Management Planned:   Additional Equipment: Fetal Monitoring  Intra-op Plan:   Post-operative Plan:   Informed Consent: I have reviewed the patients History and Physical, chart, labs and discussed the procedure including the risks, benefits and alternatives for the proposed anesthesia with the patient or authorized representative who has indicated his/her understanding and acceptance.       Plan Discussed with: Anesthesiologist  Anesthesia Plan Comments:         Anesthesia Quick Evaluation

## 2023-10-12 NOTE — Anesthesia Postprocedure Evaluation (Signed)
 Anesthesia Post Note  Patient: Robin Montoya  Procedure(s) Performed: AN AD HOC LABOR EPIDURAL     Patient location during evaluation: Mother Baby Anesthesia Type: Epidural Level of consciousness: awake and alert Pain management: pain level controlled Vital Signs Assessment: post-procedure vital signs reviewed and stable Respiratory status: spontaneous breathing, nonlabored ventilation and respiratory function stable Cardiovascular status: stable Postop Assessment: no headache, no backache and epidural receding Anesthetic complications: no   No notable events documented.  Last Vitals:  Vitals:   10/12/23 0925 10/12/23 1025  BP: 136/76 125/72  Pulse: 63 61  Resp: 20 20  Temp: 36.9 C 37 C  SpO2: 98% 98%    Last Pain:  Vitals:   10/12/23 1143  TempSrc:   PainSc: 3    Pain Goal: Patients Stated Pain Goal: 5 (10/12/23 9281)                 STEEN HOOSE

## 2023-10-12 NOTE — Progress Notes (Signed)
 Robin Montoya is a 38 y.o. H4E6986 at [redacted]w[redacted]d   Objective: BP (!) 147/83   Pulse 66   Temp 98.1 F (36.7 C) (Oral)   Resp 17   Ht 5' 3 (1.6 m)   Wt 79.8 kg   SpO2 99%   BMI 31.18 kg/m  No intake/output data recorded. No intake/output data recorded.  FHT:  FHR: 135 bpm, variability: moderate,  accelerations:  Present,  decelerations:  Absent UC:   regular, every 2 minutes SVE:   Dilation: 4 Effacement (%): 60 Station: -2 Exam by:: Claudene MD  Labs: Lab Results  Component Value Date   WBC 8.8 10/11/2023   HGB 12.6 10/11/2023   HCT 38.6 10/11/2023   MCV 88.5 10/11/2023   PLT 266 10/11/2023    Assessment / Plan: 38 yo G5P3013 @ 38.0, IOL preE w/ SF  IOL: Pitocin  on 10. Receiving 2nd PCN dose now. AROM clear fluid Preeclampsia:  Declined magnesium . Procardia  30 XL. BP mild range Fetal Wellbeing:  Category I Pain Control:  Epidural Anticipated MOD:  NSVD  Robin DELENA Claudene, DO 10/12/2023, 5:47 AM

## 2023-10-12 NOTE — MAU Note (Signed)
 Provider at bedside @0028  to discuss plan of care/admission with patient. Provider informed RN that the patient refused magnesium  sulfate.

## 2023-10-12 NOTE — Progress Notes (Signed)
 Presented to patient bedside due to elevated blood pressures. Of note, patient met requirements for PreE w/ SF based on severe range BP requiring IV labetalol  push. Declined magnesium  sulfate for seizure prophylaxis as documented in Dr Theressa admission note. Patient is denying all PreE symptoms. States that her blood pressure is always fine until she is laboring then she has issues. Advised that magnesium  sulfate is not for sole purpose of HTN management but seizure ppx. BP by RN prior to my visit was 154/82 then 171/84. Rechecked myself approx 56m after last BP and received 160/92. Again, patient denies all PreE symptoms. Strongly advised patient that she receive 24hr magnesium  sulfate postpartum at this time. Patient is still declining - understands risk of pp PreE and ecclampsia that could result in seizure during house work or child care including feeding or bathing baby. Still declining meds. Patient without IV at this time. Is amenable to replacing IV in case emergent meds are required.  Given persistent severe range BP, will order one-time IV labetalol  20mg  push. Will given second dose Nifedipine  30XL at this time and increase AM dose to 60mg . Patient amenable to this plan.  Close eye on status

## 2023-10-12 NOTE — Anesthesia Procedure Notes (Signed)
 Epidural Patient location during procedure: OB Start time: 10/12/2023 4:26 AM End time: 10/12/2023 4:32 AM  Staffing Anesthesiologist: Mallory Manus, MD  Preanesthetic Checklist Completed: patient identified, IV checked, site marked, risks and benefits discussed, surgical consent, monitors and equipment checked, pre-op evaluation and timeout performed  Epidural Patient position: sitting Prep: DuraPrep and site prepped and draped Patient monitoring: continuous pulse ox and blood pressure Approach: midline Location: L4-L5 Injection technique: LOR air  Needle:  Needle type: Tuohy  Needle gauge: 17 G Needle length: 9 cm and 9 Needle insertion depth: 6 cm Catheter type: closed end flexible Catheter size: 19 Gauge Catheter at skin depth: 12 cm Test dose: negative  Assessment Events: blood not aspirated, no cerebrospinal fluid, injection not painful, no injection resistance, no paresthesia and negative IV test

## 2023-10-12 NOTE — H&P (Signed)
 Robin Montoya is a 38 y.o. female presenting for labor evaluation.   Contractions started at 2045 and have been increasing in intensity since then. Denies LOF or VB. Had a membrane sweep in the office today.   On arrival to MAU, she had severe range BP x 2, treated with IV labetalol . She is asymptomatic.   Pregnancy is complicated by:  - History of preE w/ SF: in all prior pregnancies, has been taking ASA - Depression: sertraline  - AMA: low risk NIPT - Anemia: PO iron  OB History     Gravida  5   Para  3   Term  3   Preterm  0   AB  1   Living  3      SAB  1   IAB  0   Ectopic  0   Multiple  0   Live Births  3          Past Medical History:  Diagnosis Date   Depression    Heart murmur    insufficient   Hx of varicella    Past Surgical History:  Procedure Laterality Date   NO PAST SURGERIES     Family History: family history includes Bipolar disorder in her brother; Cancer in her maternal grandmother; Depression in her brother; Diabetes in her maternal grandfather; Heart disease in her maternal grandfather; Hypertension in her maternal grandfather and maternal grandmother; Kidney Stones in her father and mother. Social History:  reports that she has never smoked. She has never used smokeless tobacco. She reports that she does not drink alcohol and does not use drugs.     Maternal Diabetes: No Genetic Screening: Normal Maternal Ultrasounds/Referrals: Normal Fetal Ultrasounds or other Referrals:  None Maternal Substance Abuse:  No Significant Maternal Medications:  Meds include: Other:  Significant Maternal Lab Results:  Other: Sertraline  Number of Prenatal Visits:greater than 3 verified prenatal visits Maternal Vaccinations:TDap and Flu Other Comments:  None  Review of Systems  Eyes:  Negative for visual disturbance.  Respiratory:  Negative for shortness of breath.   Cardiovascular:  Negative for chest pain.  Gastrointestinal:  Positive for  abdominal pain.  Genitourinary:  Positive for pelvic pain and vaginal discharge. Negative for vaginal bleeding.  Neurological:  Negative for headaches.   History Dilation: 3.5 Effacement (%): 60 Station: -3 Exam by:: Griselda Punter, RN Blood pressure 129/84, pulse 74, temperature 98.6 F (37 C), resp. rate 17, height 5' 3 (1.6 m), weight 79.8 kg, SpO2 98%, unknown if currently breastfeeding. Exam Physical Exam Constitutional:      General: She is not in acute distress.    Appearance: She is obese.  HENT:     Head: Normocephalic and atraumatic.  Pulmonary:     Effort: Pulmonary effort is normal.  Musculoskeletal:        General: Normal range of motion.  Skin:    General: Skin is warm and dry.  Neurological:     Mental Status: She is alert.  Psychiatric:        Mood and Affect: Mood normal.        Behavior: Behavior normal.     Prenatal labs: ABO, Rh:  O positive Antibody:  Negative Rubella:  Immune RPR:   NR HBsAg:   Negative HIV:   Negative GBS: Positive/-- (12/20 0000)   Assessment/Plan: 38 yo G5P3013 @ 38.0, new diagnosis of preE w/ SF in setting of early labor - PreE w/ SF: s/p IV labetalol .  Ordered magnesium  sulfate  for seizure prophylaxis, patient declines. Her main concerns are that it hasn't helped with BP in the past and it's duration of use (intrapartum and 24 hour PP) delaying discharge. Reviewed magnesium  sulfate does have mild antihypertensive properties but indication is solely for prophylaxis of seizures and stroke. She thoroughly understands risks but ultimately declines. She is amenable to starting Procardia  30 XL which she has used previously.  - AOL: pitocin   - GBS bacteruria: PCN. Plan for AROM after 2nd dose. Has a history of rapid labor - Planning epidural  Larraine DELENA Sharps 10/12/2023, 12:23 AM

## 2023-10-13 ENCOUNTER — Other Ambulatory Visit (HOSPITAL_COMMUNITY): Payer: Self-pay

## 2023-10-13 LAB — CBC
HCT: 34 % — ABNORMAL LOW (ref 36.0–46.0)
Hemoglobin: 10.7 g/dL — ABNORMAL LOW (ref 12.0–15.0)
MCH: 28 pg (ref 26.0–34.0)
MCHC: 31.5 g/dL (ref 30.0–36.0)
MCV: 89 fL (ref 80.0–100.0)
Platelets: 212 10*3/uL (ref 150–400)
RBC: 3.82 MIL/uL — ABNORMAL LOW (ref 3.87–5.11)
RDW: 18.9 % — ABNORMAL HIGH (ref 11.5–15.5)
WBC: 8.3 10*3/uL (ref 4.0–10.5)
nRBC: 0 % (ref 0.0–0.2)

## 2023-10-13 MED ORDER — IBUPROFEN 200 MG PO TABS
600.0000 mg | ORAL_TABLET | Freq: Four times a day (QID) | ORAL | Status: AC | PRN
Start: 2023-10-13 — End: ?

## 2023-10-13 MED ORDER — ACETAMINOPHEN 325 MG PO TABS: 650.0000 mg | ORAL_TABLET | Freq: Four times a day (QID) | ORAL | Status: AC | PRN

## 2023-10-13 MED ORDER — NIFEDIPINE ER 60 MG PO TB24
60.0000 mg | ORAL_TABLET | Freq: Every day | ORAL | 11 refills | Status: AC
  Filled 2023-10-13: qty 30, 30d supply, fill #0

## 2023-10-13 NOTE — Progress Notes (Signed)
 MOB was referred for history of depression  * Referral screened out by Clinical Social Worker because none of the following criteria appear to apply:  ~ History of depression during this pregnancy, or of post-partum depression following prior delivery.  ~ Diagnosis of depression within last 3 years  OR  * MOB's symptoms currently being treated with medication and/or therapy.  Per OB notes, MOB has an active prescription for Sertraline  100mg  for support.  Please contact the Clinical Social Worker if needs arise, by [facility] request, or if MOB scores greater than 9/yes to question 10 on Edinburgh Postpartum Depression Screen.  Rosina Molt, [place] Clinical Social Worker [phone]

## 2023-10-13 NOTE — Discharge Summary (Signed)
 Postpartum Discharge Summary  Date of Service updated 10/13/23      Patient Name: Robin Montoya DOB: June 27, 1985 MRN: [id number]  Date of admission: 10/11/2023 Delivery date:10/12/2023 Delivering provider: NICHOLAUS ALMARIE HERO Date of discharge: 10/13/2023  Admitting diagnosis: Preeclampsia, severe, third trimester [O14.13] Intrauterine pregnancy: [redacted]w[redacted]d     Secondary diagnosis:  Principal Problem:   Preeclampsia, severe, third trimester  Additional problems: depression, advanced maternal age    Discharge diagnosis: Term Pregnancy Delivered and Preeclampsia (severe)                                              Post partum procedures:none Augmentation: AROM and Pitocin  Complications: None  Hospital course: Induction of Labor With Vaginal Delivery   38 y.o. yo H4E5985 at [redacted]w[redacted]d was admitted to the hospital 10/11/2023 for induction of labor.  Indication for induction: Preeclampsia.  Patient had an labor course complicated by nothing Membrane Rupture Time/Date: 5:39 AM,10/12/2023  Delivery Method:Vaginal, Spontaneous Operative Delivery:N/A Episiotomy: None Lacerations:  Labial Details of delivery can be found in separate delivery note.  Patient had a postpartum course complicated by severe range blood pressures requiring IV antihypertensives. She refused magnesium  postpartum for seizure prophylaxis. She was started on Procardia  XL 60mg  with good response.  Strongly desired discharge home on PPD1, agreed to home BP checks and close outpatient follow up.  Patient is discharged home 10/13/23.  Newborn Data: Birth date:10/12/2023 Birth time:7:38 AM Gender:Female Living status:Living Apgars:9 ,9  Weight:2890 g  Magnesium  Sulfate received: No BMZ received: No Rhophylac:N/A MMR:N/A T-DaP:Given prenatally Flu: Yes RSV Vaccine received: No Transfusion:No Immunizations administered: Immunization History  Administered Date(s) Administered   Influenza Split 02/06/2013   Influenza Whole  01/06/2007, 12/29/2008   Tdap 02/08/2013    Physical exam  Vitals:   10/13/23 0500 10/13/23 0941 10/13/23 1144 10/13/23 1352  BP: 139/78 (!) 140/88 (!) 140/83 (!) (P) 144/73  Pulse: 60 63 60 (!) (P) 57  Resp: 18     Temp:      TempSrc: Oral     SpO2: 100%     Weight:      Height:       General: alert, cooperative, and no distress Lochia: appropriate Uterine Fundus: firm Incision: N/A DVT Evaluation: No evidence of DVT seen on physical exam. Labs: Lab Results  Component Value Date   WBC 8.3 10/13/2023   HGB 10.7 (L) 10/13/2023   HCT 34.0 (L) 10/13/2023   MCV 89.0 10/13/2023   PLT 212 10/13/2023      Latest Ref Rng & Units 10/11/2023   10:30 PM  CMP  Glucose 70 - 99 mg/dL 86   BUN 6 - 20 mg/dL 6   Creatinine 9.55 - 8.99 mg/dL 9.31   Sodium 864 - 854 mmol/L 133   Potassium 3.5 - 5.1 mmol/L 3.7   Chloride 98 - 111 mmol/L 104   CO2 22 - 32 mmol/L 21   Calcium 8.9 - 10.3 mg/dL 9.3   Total Protein 6.5 - 8.1 g/dL 6.5   Total Bilirubin 0.0 - 1.2 mg/dL 0.4   Alkaline Phos 38 - 126 U/L 169   AST 15 - 41 U/L 15   ALT 0 - 44 U/L 10    Edinburgh Score:    10/12/2023    5:46 PM  Edinburgh Postnatal Depression Scale Screening Tool  I have been able  to laugh and see the funny side of things. 0  I have looked forward with enjoyment to things. 0  I have blamed myself unnecessarily when things went wrong. 1  I have been anxious or worried for no good reason. 0  I have felt scared or panicky for no good reason. 0  Things have been getting on top of me. 0  I have been so unhappy that I have had difficulty sleeping. 0  I have felt sad or miserable. 0  I have been so unhappy that I have been crying. 0  The thought of harming myself has occurred to me. 0  Edinburgh Postnatal Depression Scale Total 1      After visit meds:  Allergies as of 10/13/2023   No Known Allergies      Medication List     STOP taking these medications    aspirin EC 81 MG tablet       TAKE  these medications    acetaminophen  325 MG tablet Commonly known as: Tylenol  Take 2 tablets (650 mg total) by mouth every 6 (six) hours as needed (for pain scale < 4).   famotidine  20 MG tablet Commonly known as: PEPCID  Take 20 mg by mouth at bedtime.   ferrous sulfate  325 (65 FE) MG EC tablet Take 325 mg by mouth 3 (three) times daily with meals.   ibuprofen  200 MG tablet Commonly known as: ADVIL  Take 3 tablets (600 mg total) by mouth every 6 (six) hours as needed.   NIFEdipine  60 MG 24 hr tablet Commonly known as: ADALAT  CC Take 1 tablet (60 mg total) by mouth daily.   omeprazole 20 MG capsule Commonly known as: PRILOSEC Take 20 mg by mouth daily.   prenatal multivitamin Tabs tablet Take 1 tablet by mouth daily at 12 noon.   sertraline  50 MG tablet Commonly known as: ZOLOFT  Take 50 mg by mouth at bedtime.         Discharge home in stable condition Infant Feeding: Breast Infant Disposition:home with mother Discharge instruction: per After Visit Summary and Postpartum booklet. Activity: Advance as tolerated. Pelvic rest for 6 weeks.  Diet: routine diet Anticipated Birth Control: Unsure Postpartum Appointment:4 weeks Additional Postpartum F/U: BP check 2-3 days Future Appointments:No future appointments. Follow up Visit:  Follow-up Information     Ob/Gyn, Landy Stains. Schedule an appointment as soon as possible for a visit.   Why: Monday or Tuesday next week (7/7 or 7/8) for blood pressure check Contact information: [address] [place] [address] [phone]                     10/13/2023 Rosaline FORBES Chapel, MD

## 2023-10-13 NOTE — Progress Notes (Signed)
 Post Partum Day 1 Subjective: Patient is doing well this morning. Pain is controlled. Ambulating, voiding, tolerating PO. Minimal lochia. Breastfeeding. No headache, vision changes, RUQ pain  Objective: Patient Vitals for the past 24 hrs:  BP Temp Temp src Pulse Resp SpO2  10/13/23 0941 (!) 140/88 -- -- (P) 63 -- --  10/13/23 0500 139/78 -- Oral 60 18 100 %  10/13/23 0203 136/87 -- -- 62 -- --  10/13/23 0031 124/81 -- -- (!) 58 -- --  10/12/23 2230 (!) 171/86 -- -- (!) 55 -- --  10/12/23 2222 (!) 154/82 99 F (37.2 C) Oral (!) 51 17 100 %  10/12/23 1823 133/81 98.3 F (36.8 C) Oral (!) 55 -- 99 %  10/12/23 1431 (!) 115/57 98.4 F (36.9 C) Oral 69 18 97 %    Physical Exam:  General: alert, cooperative, and no distress Lochia: appropriate Uterine Fundus: firm DVT Evaluation: No evidence of DVT seen on physical exam.  Recent Labs    10/11/23 2230 10/12/23 0827 10/13/23 0434  WBC 8.8 9.7 8.3  HGB 12.6 11.5* 10.7*  HCT 38.6 35.1* 34.0*  PLT 266 233 212    Recent Labs    10/11/23 2230  NA 133*  K 3.7  CL 104  BUN 6  CREATININE 0.68  GLUCOSE 86  BILITOT 0.4  ALT 10  AST 15  ALKPHOS 169*  PROT 6.5  ALBUMIN 3.0*    Recent Labs    10/11/23 2230  CALCIUM 9.3    No results for input(s): PROTIME, APTT, INR in the last 72 hours.  No results for input(s): PROTIME, APTT, INR, FIBRINOGEN in the last 72 hours. Assessment/Plan: Robin Montoya 38 y.o. H4E5985 PPD#1 sp SVD 1. PPC: routine PP care 2. PreE with SF: refused postpartum magnesium . Received IV labetalol  yesterday evening and has been mostly normotensive/mild range Bps overnight, now on Procardia  XL 60mg . Continue to monitor.  3. Dispo: discussed with patient, would prefer to monitor blood pressures another day, patient strongly requesting discharge home today. Will plan to watch Bps today and if normotensive/mild range this afternoon, will discharge home with plan for home BP monitoring.  Patient aware of risk of untreated severe range blood pressures.     LOS: 1 day   Robin Montoya 10/13/2023, 11:29 AM

## 2023-10-17 LAB — SURGICAL PATHOLOGY

## 2023-10-22 ENCOUNTER — Telehealth (HOSPITAL_COMMUNITY): Payer: Self-pay | Admitting: *Deleted

## 2023-10-22 ENCOUNTER — Telehealth (HOSPITAL_COMMUNITY): Payer: Self-pay

## 2023-10-22 NOTE — Telephone Encounter (Signed)
 10/22/2023 1438  Name: Robin Montoya MRN: [id number] DOB: 14-Feb-1986  Reason for Call:  Transition of Care Hospital Discharge Call  Contact Status: Patient Contact Status:  (Patient states that they are doing great but that they are at an appointment. Patient states she would like a call back at a later time.)  Language assistant needed:          Follow-Up Questions: Do You Have Any Concerns About Your Health As You Heal From Delivery?: No Do You Have Any Concerns About Your Infants Health?: No  Edinburgh Postnatal Depression Scale:  In the Past 7 Days:    PHQ2-9 Depression Scale:     Discharge Follow-up:    Post-discharge interventions: NA  Signature  Rosaline Deretha PEAK

## 2023-10-22 NOTE — Telephone Encounter (Signed)
 Attempted hospital discharge follow-up call. Left message for patient to return RN call with any questions or concerns. Allean IVAR Carton, RN, 10/22/23, [phone]

## 2023-11-04 ENCOUNTER — Encounter (HOSPITAL_COMMUNITY)
